# Patient Record
Sex: Female | Born: 1940 | Race: White | Hispanic: No | Smoking: Never smoker
Health system: Southern US, Community
[De-identification: ages and names within clinical notes are randomized; demographics above are authoritative.]

## PROBLEM LIST (undated history)

## (undated) ENCOUNTER — Emergency Department (HOSPITAL_COMMUNITY): Payer: Medicare Other | Source: Home / Self Care

## (undated) DIAGNOSIS — E119 Type 2 diabetes mellitus without complications: Secondary | ICD-10-CM

## (undated) DIAGNOSIS — F419 Anxiety disorder, unspecified: Secondary | ICD-10-CM

## (undated) DIAGNOSIS — K219 Gastro-esophageal reflux disease without esophagitis: Secondary | ICD-10-CM

## (undated) DIAGNOSIS — Z87442 Personal history of urinary calculi: Secondary | ICD-10-CM

## (undated) DIAGNOSIS — J309 Allergic rhinitis, unspecified: Secondary | ICD-10-CM

## (undated) DIAGNOSIS — M1990 Unspecified osteoarthritis, unspecified site: Secondary | ICD-10-CM

## (undated) DIAGNOSIS — H9192 Unspecified hearing loss, left ear: Secondary | ICD-10-CM

## (undated) DIAGNOSIS — G4730 Sleep apnea, unspecified: Secondary | ICD-10-CM

## (undated) HISTORY — PX: TUBAL LIGATION: SHX77

## (undated) HISTORY — PX: EYE SURGERY: SHX253

## (undated) HISTORY — PX: BREAST CYST ASPIRATION: SHX578

## (undated) HISTORY — PX: TONSILLECTOMY: SUR1361

---

## 2003-04-30 ENCOUNTER — Encounter: Admission: RE | Admit: 2003-04-30 | Discharge: 2003-04-30 | Payer: Self-pay

## 2005-07-20 ENCOUNTER — Ambulatory Visit: Payer: Self-pay | Admitting: Internal Medicine

## 2005-12-30 ENCOUNTER — Ambulatory Visit: Payer: Self-pay | Admitting: Internal Medicine

## 2006-05-09 HISTORY — PX: BREAST BIOPSY: SHX20

## 2006-11-02 ENCOUNTER — Ambulatory Visit: Payer: Self-pay | Admitting: Internal Medicine

## 2006-11-28 ENCOUNTER — Ambulatory Visit: Payer: Self-pay | Admitting: Surgery

## 2007-02-06 ENCOUNTER — Ambulatory Visit: Payer: Self-pay | Admitting: Gastroenterology

## 2007-05-21 ENCOUNTER — Ambulatory Visit: Payer: Self-pay | Admitting: Surgery

## 2007-11-05 ENCOUNTER — Ambulatory Visit: Payer: Self-pay | Admitting: Internal Medicine

## 2008-03-05 ENCOUNTER — Ambulatory Visit: Payer: Self-pay | Admitting: Internal Medicine

## 2008-03-09 ENCOUNTER — Ambulatory Visit: Payer: Self-pay | Admitting: Internal Medicine

## 2008-04-08 ENCOUNTER — Ambulatory Visit: Payer: Self-pay | Admitting: Internal Medicine

## 2008-05-09 ENCOUNTER — Ambulatory Visit: Payer: Self-pay | Admitting: Internal Medicine

## 2008-12-18 ENCOUNTER — Ambulatory Visit: Payer: Self-pay | Admitting: Internal Medicine

## 2010-06-03 ENCOUNTER — Ambulatory Visit: Payer: Self-pay | Admitting: Internal Medicine

## 2011-09-22 ENCOUNTER — Ambulatory Visit: Payer: Self-pay | Admitting: Internal Medicine

## 2013-01-22 ENCOUNTER — Ambulatory Visit: Payer: Self-pay | Admitting: Internal Medicine

## 2014-02-17 ENCOUNTER — Ambulatory Visit: Payer: Self-pay | Admitting: Physician Assistant

## 2014-02-27 ENCOUNTER — Ambulatory Visit: Payer: Self-pay | Admitting: Ophthalmology

## 2014-03-26 DIAGNOSIS — J302 Other seasonal allergic rhinitis: Secondary | ICD-10-CM | POA: Insufficient documentation

## 2014-03-31 ENCOUNTER — Ambulatory Visit: Payer: Self-pay | Admitting: Ophthalmology

## 2014-04-01 ENCOUNTER — Ambulatory Visit: Payer: Self-pay | Admitting: Ophthalmology

## 2014-04-23 ENCOUNTER — Ambulatory Visit: Payer: Self-pay | Admitting: Internal Medicine

## 2014-05-13 ENCOUNTER — Ambulatory Visit: Payer: Self-pay | Admitting: Internal Medicine

## 2014-08-30 NOTE — Op Note (Signed)
PATIENT NAME:  Gabriella [name], Gabriella [name] MR#:  [id number] DATE OF BIRTH:  01-Jul-1940  DATE OF PROCEDURE:  04/01/2014  PREOPERATIVE DIAGNOSIS:  Nuclear sclerotic cataract of the left eye.   POSTOPERATIVE DIAGNOSIS:  Nuclear sclerotic cataract of the left eye.   OPERATIVE PROCEDURE:  Cataract extraction by phacoemulsification with implant of intraocular lens to left eye.   SURGEON:  Birder Robson, MD.   ANESTHESIA:  1. Managed anesthesia care.  2. Topical tetracaine drops followed by 2% Xylocaine jelly applied in the preoperative holding area.   COMPLICATIONS:  None.   TECHNIQUE:   Stop and chop.  DESCRIPTION OF PROCEDURE:  The patient was examined and consented in the preoperative holding area where the aforementioned topical anesthesia was applied to the left eye and then brought back to the operating room where the left eye was prepped and draped in the usual sterile ophthalmic fashion and a lid speculum was placed. A paracentesis was created with the side port blade and the anterior chamber was filled with viscoelastic. A near clear corneal incision was performed with the steel keratome. A continuous curvilinear capsulorrhexis was performed with a cystotome followed by the capsulorrhexis forceps. Hydrodissection and hydrodelineation were carried out with BSS on a blunt cannula. The lens was removed in a stop and chop technique and the remaining cortical material was removed with the irrigation-aspiration handpiece. The capsular bag was inflated with viscoelastic and the Tecnis ZCB00 21.5 diopter lens, serial number [id number] was placed in the capsular bag without complication. The remaining viscoelastic was removed from the eye with the irrigation-aspiration handpiece. The wounds were hydrated. The anterior chamber was flushed with Miostat and the eye was inflated to physiologic pressure. 0.1 mL of cefuroxime concentration 10 mg/mL was placed in the anterior chamber. The wounds were found to be water  tight. The eye was dressed with Vigamox. The patient was given protective glasses to wear throughout the day and a shield with which to sleep tonight. The patient was also given drops with which to begin a drop regimen today and will follow-up with me in one day.    ____________________________ Livingston Diones. Javonne Dorko, MD wlp:bm D: 04/01/2014 17:31:54 ET T: 04/01/2014 23:18:41 ET JOB#: [id number]  cc: Ireoluwa Gorsline L. Shakala Marlatt, MD, <Dictator> Livingston Diones Tianah Lonardo MD ELECTRONICALLY SIGNED 04/02/2014 15:25

## 2015-05-27 DIAGNOSIS — E119 Type 2 diabetes mellitus without complications: Secondary | ICD-10-CM | POA: Insufficient documentation

## 2015-07-30 ENCOUNTER — Encounter: Admission: RE | Payer: Self-pay | Source: Ambulatory Visit

## 2015-07-30 ENCOUNTER — Ambulatory Visit: Admission: RE | Admit: 2015-07-30 | Payer: Medicare Other | Source: Ambulatory Visit | Admitting: Gastroenterology

## 2015-07-30 SURGERY — COLONOSCOPY WITH PROPOFOL
Anesthesia: General

## 2016-01-05 ENCOUNTER — Other Ambulatory Visit: Payer: Self-pay | Admitting: Internal Medicine

## 2016-01-05 DIAGNOSIS — Z1231 Encounter for screening mammogram for malignant neoplasm of breast: Secondary | ICD-10-CM

## 2016-01-22 ENCOUNTER — Ambulatory Visit: Payer: Medicare Other

## 2016-03-02 ENCOUNTER — Other Ambulatory Visit: Payer: Self-pay | Admitting: Internal Medicine

## 2016-03-02 ENCOUNTER — Ambulatory Visit
Admission: RE | Admit: 2016-03-02 | Discharge: 2016-03-02 | Disposition: A | Discharge status: Home / Self Care | Payer: Medicare Other | Source: Ambulatory Visit | Attending: Internal Medicine | Admitting: Internal Medicine

## 2016-03-02 DIAGNOSIS — Z1231 Encounter for screening mammogram for malignant neoplasm of breast: Secondary | ICD-10-CM

## 2016-03-02 DIAGNOSIS — R928 Other abnormal and inconclusive findings on diagnostic imaging of breast: Secondary | ICD-10-CM | POA: Insufficient documentation

## 2016-03-04 ENCOUNTER — Other Ambulatory Visit: Payer: Self-pay | Admitting: Internal Medicine

## 2016-03-04 DIAGNOSIS — N6320 Unspecified lump in the left breast, unspecified quadrant: Secondary | ICD-10-CM

## 2016-03-04 DIAGNOSIS — R928 Other abnormal and inconclusive findings on diagnostic imaging of breast: Secondary | ICD-10-CM

## 2016-03-22 ENCOUNTER — Other Ambulatory Visit: Payer: Medicare Other

## 2016-03-22 ENCOUNTER — Ambulatory Visit: Payer: Medicare Other

## 2016-04-18 ENCOUNTER — Ambulatory Visit
Admission: EM | Admit: 2016-04-18 | Discharge: 2016-04-18 | Discharge status: Against Medical Advice | Payer: Medicare Other | Attending: Family Medicine | Admitting: Family Medicine

## 2016-04-18 NOTE — ED Triage Notes (Signed)
Pt hurt her knee about 2 months ago, and then 2 weeks after that she hurt her left foot. It gets better then it worse again, She went to Urgent Care at hospital and they x ray it and there arent any broken bones. However she continues to have swelling in her foot and ankle and her knee gives out.

## 2016-04-18 NOTE — ED Provider Notes (Signed)
Patient left urgent care without being seen.    Marylene Land, NP 04/18/16 [phone]

## 2016-04-19 ENCOUNTER — Ambulatory Visit
Admission: EM | Admit: 2016-04-19 | Discharge: 2016-04-19 | Disposition: A | Discharge status: Home / Self Care | Payer: Medicare Other | Attending: Family Medicine | Admitting: Family Medicine

## 2016-04-19 ENCOUNTER — Encounter: Payer: Self-pay | Admitting: Emergency Medicine

## 2016-04-19 ENCOUNTER — Ambulatory Visit
Admission: RE | Admit: 2016-04-19 | Discharge: 2016-04-19 | Disposition: A | Discharge status: Home / Self Care | Payer: Medicare Other | Source: Ambulatory Visit | Attending: Internal Medicine | Admitting: Internal Medicine

## 2016-04-19 DIAGNOSIS — M25562 Pain in left knee: Secondary | ICD-10-CM

## 2016-04-19 DIAGNOSIS — N6322 Unspecified lump in the left breast, upper inner quadrant: Secondary | ICD-10-CM | POA: Insufficient documentation

## 2016-04-19 DIAGNOSIS — M79672 Pain in left foot: Secondary | ICD-10-CM | POA: Diagnosis not present

## 2016-04-19 DIAGNOSIS — N6320 Unspecified lump in the left breast, unspecified quadrant: Secondary | ICD-10-CM

## 2016-04-19 DIAGNOSIS — R928 Other abnormal and inconclusive findings on diagnostic imaging of breast: Secondary | ICD-10-CM

## 2016-04-19 DIAGNOSIS — M25472 Effusion, left ankle: Secondary | ICD-10-CM

## 2016-04-19 HISTORY — DX: Type 2 diabetes mellitus without complications: E11.9

## 2016-04-19 MED ORDER — DICLOFENAC SODIUM 75 MG PO TBEC: 75.0000 mg | DELAYED_RELEASE_TABLET | Freq: Two times a day (BID) | ORAL | 0 refills | Status: DC

## 2016-04-19 MED ORDER — KETOROLAC TROMETHAMINE 60 MG/2ML IM SOLN
60.0000 mg | Freq: Once | INTRAMUSCULAR | Status: AC
  Administered 2016-04-19: 60 mg via INTRAMUSCULAR

## 2016-04-19 NOTE — Discharge Instructions (Signed)
You were given a shot of Toradol today to help with inflammation. May take Voltaren twice a day as needed for pain and swelling. Elevated foot and knee as much as possible. Recommend referral to Orthopedic for further evaluation. Call today to make an appointment.

## 2016-04-19 NOTE — ED Triage Notes (Signed)
Patient states that her left knee gave out and has pain in her left knee for the past month.  Patient also states that couple of days ago she twisted her left foot and has had pain in her left foot.

## 2016-04-19 NOTE — ED Provider Notes (Signed)
CSN: [id number]     Arrival date & time 04/19/16  1016 History   First MD Initiated Contact with Patient 04/19/16 1239     Chief Complaint  Patient presents with  . Knee Pain    left  . Foot Pain    left   (Consider location/radiation/quality/duration/timing/severity/associated sxs/prior Treatment) 75 year old female presents with recurrent left knee pain. She had fallen and "pulled" her knee 3-4 months ago. Knee was healing but then she stood up from sitting position at home about 2 months ago and her knee "went out" again. She went to Urgent Care [facility]) and had x-rays done. No fracture or dislocation- thought to have a ligament strain. Rested knee and pain and symptoms improved but 2 days ago stood up quickly and tried to put weight on her left knee and it "gave out" again. Was hurting more yesterday but better today with rest. She has taken anti-inflammatory medication (uncertain of name) with minimal relief. Also having pain and swelling of her left ankle/foot. Has seen a Podiatrist (Dr. Vickki Muff) early this year for plantar fascitis. But now having more swelling around her ankle. Swelling improves when she elevates her foot but she states "I can't sit and put my foot and knee up all day".  No other chronic health issues except diabetes and anxiety.    The history is provided by the patient.    Past Medical History:  Diagnosis Date  . Diabetes mellitus without complication [facility])    Past Surgical History:  Procedure Laterality Date  . BREAST BIOPSY Right 2008   stereo-benign  . BREAST CYST ASPIRATION Left    History reviewed. No pertinent family history. Social History  Substance Use Topics  . Smoking status: Never Smoker  . Smokeless tobacco: Never Used  . Alcohol use No   OB History    No data available     Review of Systems  Constitutional: Negative for fatigue and fever.  Respiratory: Negative for chest tightness and shortness of breath.   Cardiovascular:  Positive for leg swelling. Negative for chest pain.  Musculoskeletal: Positive for arthralgias, gait problem (unable to fully put weight on left knee and foot) and joint swelling. Negative for back pain.  Skin: Negative for color change and rash.  Neurological: Negative for tremors, weakness and numbness.  Hematological: Negative for adenopathy. Does not bruise/bleed easily.    Allergies  Patient has no known allergies.  Home Medications   Prior to Admission medications   Medication Sig Start Date End Date Taking? Authorizing Provider  escitalopram (LEXAPRO) 10 MG tablet Take 10 mg by mouth daily.   Yes Historical Provider, MD  fluticasone (FLONASE) 50 MCG/ACT nasal spray Place 1 spray into both nostrils daily.   Yes Historical Provider, MD  glimepiride (AMARYL) 4 MG tablet Take 4 mg by mouth daily with breakfast.   Yes Historical Provider, MD  metFORMIN (GLUMETZA) 500 MG (MOD) 24 hr tablet Take 500 mg by mouth daily with breakfast.   Yes Historical Provider, MD  Multiple Vitamin (MULTIVITAMIN) tablet Take 1 tablet by mouth daily.   Yes Historical Provider, MD  omeprazole (PRILOSEC) 20 MG capsule Take 20 mg by mouth daily.   Yes Historical Provider, MD  diclofenac (VOLTAREN) 75 MG EC tablet Take 1 tablet (75 mg total) by mouth 2 (two) times daily. 04/19/16   Katy Apo, NP   Meds Ordered and Administered this Visit   Medications  ketorolac (TORADOL) injection 60 mg (60 mg Intramuscular Given  04/19/16 1315)    BP (!) 152/93 (BP Location: Left Arm)   Pulse 85   Temp 97.8 F (36.6 C) (Oral)   Resp 16   Ht 5\' 7"  (1.702 m)   Wt 180 lb (81.6 kg)   SpO2 100%   BMI 28.19 kg/m  No data found.   Physical Exam  Constitutional: She is oriented to person, place, and time. She appears well-developed and well-nourished. No distress.  Cardiovascular: Normal rate, regular rhythm, intact distal pulses and normal pulses.   Musculoskeletal: She exhibits edema and tenderness.       Left  knee: She exhibits decreased range of motion. She exhibits no swelling, no effusion, no ecchymosis, no deformity, no laceration, no erythema and normal patellar mobility. Tenderness found. Medial joint line and lateral joint line tenderness noted.       Left foot: There is tenderness and swelling. There is normal range of motion, normal capillary refill and no deformity.  Has decreased range of motion of left knee, especially with full flexion and extension. Tender along medial and lateral ligaments. Minimal swelling present. No bruising seen.  Left foot/ankle has swelling present on medial aspect of ankle and extending to dorsal aspect of foot. Non-pitting. Has full range of motion but with pain with rotation. Pulses are normal and equal bilaterally. Good capillary refill. No neuro/sensory deficits noted.   Feet:  Right Foot:  Skin Integrity: Negative for skin breakdown, erythema or warmth.  Left Foot:  Skin Integrity: Negative for skin breakdown, erythema or warmth.  Neurological: She is alert and oriented to person, place, and time. She has normal strength. No sensory deficit.  Skin: Skin is warm and dry. Capillary refill takes less than 2 seconds. No rash noted.  Psychiatric: Her speech is normal. Judgment and thought content normal. Her mood appears anxious. She is agitated. Cognition and memory are normal.    Urgent Care Course   Clinical Course     Procedures (including critical care time)  Labs Review Labs Reviewed - No data to display  Imaging Review US Breast Ltd Uni Left Inc Axilla  Result Date: 04/19/2016 CLINICAL DATA:  Screening recall for a left breast mass and left breast distortion. EXAM: 2D DIGITAL DIAGNOSTIC UNILATERAL LEFT MAMMOGRAM WITH CAD AND ADJUNCT TOMO LEFT BREAST ULTRASOUND COMPARISON:  Previous exam(s). ACR Breast Density Category c: The breast tissue is heterogeneously dense, which may obscure small masses. FINDINGS: The mass of concern in the upper inner  quadrant of the left breast, far posterior depth appears stable on the additional images as compared with several prior mammograms. There is however a subtle distortion in the upper inner quadrant of the left breast, posterior depth which warrants further evaluation with ultrasound. Mammographic images were processed with CAD. No palpable masses are identified in the upper inner quadrant of the left breast on physical exam. Ultrasound targeted to the upper inner quadrant of the left breast demonstrates no suspicious masses or areas of shadowing. Ultrasound of the left axilla demonstrates multiple normal-appearing lymph nodes. IMPRESSION: 1. There is a suspicious area of distortion in the upper inner quadrant of the left breast. 2.  No evidence of left axillary lymphadenopathy. RECOMMENDATION: Stereotactic biopsy is recommended for the distortion in the upper inner left breast. I have discussed the findings and recommendations with the patient. Results were also provided in writing at the conclusion of the visit. If applicable, a reminder letter will be sent to the patient regarding the next appointment. BI-RADS CATEGORY  4:  Suspicious. Electronically Signed   By: Ammie Ferrier M.D.   On: 04/19/2016 16:39   Mm Diag Breast Tomo Uni Left  Result Date: 04/19/2016 CLINICAL DATA:  Screening recall for a left breast mass and left breast distortion. EXAM: 2D DIGITAL DIAGNOSTIC UNILATERAL LEFT MAMMOGRAM WITH CAD AND ADJUNCT TOMO LEFT BREAST ULTRASOUND COMPARISON:  Previous exam(s). ACR Breast Density Category c: The breast tissue is heterogeneously dense, which may obscure small masses. FINDINGS: The mass of concern in the upper inner quadrant of the left breast, far posterior depth appears stable on the additional images as compared with several prior mammograms. There is however a subtle distortion in the upper inner quadrant of the left breast, posterior depth which warrants further evaluation with ultrasound.  Mammographic images were processed with CAD. No palpable masses are identified in the upper inner quadrant of the left breast on physical exam. Ultrasound targeted to the upper inner quadrant of the left breast demonstrates no suspicious masses or areas of shadowing. Ultrasound of the left axilla demonstrates multiple normal-appearing lymph nodes. IMPRESSION: 1. There is a suspicious area of distortion in the upper inner quadrant of the left breast. 2.  No evidence of left axillary lymphadenopathy. RECOMMENDATION: Stereotactic biopsy is recommended for the distortion in the upper inner left breast. I have discussed the findings and recommendations with the patient. Results were also provided in writing at the conclusion of the visit. If applicable, a reminder letter will be sent to the patient regarding the next appointment. BI-RADS CATEGORY  4: Suspicious. Electronically Signed   By: Ammie Ferrier M.D.   On: 04/19/2016 16:39     Visual Acuity Review  Right Eye Distance:   Left Eye Distance:   Bilateral Distance:    Right Eye Near:   Left Eye Near:    Bilateral Near:         MDM   1. Acute pain of left knee   2. Left ankle swelling   3. Foot pain, left    Patient anxious due to appointment within 2 hours for imaging for possible breast mass. Discussed with patient that she may have a knee ligament strain but additional evaluation is needed. Gave Toradol 60mg  IM today (no labs available to review kidney function but patient states no issues with renal function)- so a 30mg  dose would probably be more appropriate in the future. Encouraged to continue elevating knee and foot. May take Voltaren 75mg  twice a day as needed for pain and swelling. Recommend referral to Orthopedic for further evaluation and treatment for knee and foot/ankle pain. Patient will call today to schedule appointment or obtain referral through PCP. Follow-up with the Orthopedic as planned.      Katy Apo,  NP 04/20/16 1023

## 2016-04-19 NOTE — ED Notes (Signed)
Patient unable to stay for the medication hold due to that she has leave now to make her [place] appointment for this afternoon.

## 2016-04-20 ENCOUNTER — Other Ambulatory Visit: Payer: Self-pay | Admitting: Family Medicine

## 2016-04-20 DIAGNOSIS — R928 Other abnormal and inconclusive findings on diagnostic imaging of breast: Secondary | ICD-10-CM

## 2016-04-22 ENCOUNTER — Other Ambulatory Visit: Payer: Self-pay | Admitting: Internal Medicine

## 2016-04-22 DIAGNOSIS — R928 Other abnormal and inconclusive findings on diagnostic imaging of breast: Secondary | ICD-10-CM

## 2016-05-17 ENCOUNTER — Ambulatory Visit
Admission: RE | Admit: 2016-05-17 | Discharge: 2016-05-17 | Disposition: A | Discharge status: Home / Self Care | Payer: Medicare Other | Source: Ambulatory Visit | Attending: Internal Medicine | Admitting: Internal Medicine

## 2016-05-17 ENCOUNTER — Other Ambulatory Visit: Payer: Self-pay | Admitting: Internal Medicine

## 2016-05-17 DIAGNOSIS — R928 Other abnormal and inconclusive findings on diagnostic imaging of breast: Secondary | ICD-10-CM

## 2016-05-17 DIAGNOSIS — N6032 Fibrosclerosis of left breast: Secondary | ICD-10-CM | POA: Diagnosis not present

## 2016-05-17 DIAGNOSIS — N6092 Unspecified benign mammary dysplasia of left breast: Secondary | ICD-10-CM | POA: Diagnosis not present

## 2016-05-17 HISTORY — PX: BREAST BIOPSY: SHX20

## 2016-05-18 LAB — SURGICAL PATHOLOGY

## 2016-05-19 ENCOUNTER — Telehealth: Payer: Self-pay | Admitting: *Deleted

## 2016-05-19 NOTE — Telephone Encounter (Signed)
Request from Kalman Jewels in radiology to assist with scheduling patient for a surgical consult for benign breast biopsy.  Called patient.  She request a female Psychologist, sport and exercise.  Scheduled her to see Dr. Azalee Course on June 03, 2016 @ 10:45.  She is to call if she has any questions or needs.

## 2016-05-31 ENCOUNTER — Other Ambulatory Visit: Payer: Self-pay

## 2016-05-31 DIAGNOSIS — G4733 Obstructive sleep apnea (adult) (pediatric): Secondary | ICD-10-CM | POA: Insufficient documentation

## 2016-05-31 DIAGNOSIS — F419 Anxiety disorder, unspecified: Secondary | ICD-10-CM | POA: Insufficient documentation

## 2016-05-31 DIAGNOSIS — K219 Gastro-esophageal reflux disease without esophagitis: Secondary | ICD-10-CM | POA: Insufficient documentation

## 2016-06-02 ENCOUNTER — Telehealth: Payer: Self-pay | Admitting: *Deleted

## 2016-06-02 NOTE — Telephone Encounter (Signed)
Patient called yesterday with concerns that the surgeon I had scheduled her with was no longer there, and the office would call her to reschedule her with another surgeon.  Reviewed pathology and reasons for needing a surgical consult.  Scheduled patient to see Dr. Tamala Julian on 06/07/16 at 3:30.

## 2016-06-03 ENCOUNTER — Ambulatory Visit: Payer: Self-pay | Admitting: Surgery

## 2016-06-07 ENCOUNTER — Encounter: Payer: Self-pay | Admitting: *Deleted

## 2016-06-09 NOTE — Progress Notes (Signed)
Went with patient to see Dr. Tamala Julian for her surgical consult.  Offered support.  Option was given for excisional biopsy or close observation with a six month follow mammogram.  Patient chose 6 month follow-up.  She is to call if she has any questions or needs.

## 2016-09-15 ENCOUNTER — Other Ambulatory Visit: Payer: Self-pay | Admitting: Surgery

## 2016-09-15 DIAGNOSIS — N6320 Unspecified lump in the left breast, unspecified quadrant: Secondary | ICD-10-CM

## 2016-11-16 ENCOUNTER — Ambulatory Visit
Admission: RE | Admit: 2016-11-16 | Discharge: 2016-11-16 | Disposition: A | Discharge status: Home / Self Care | Payer: Medicare Other | Source: Ambulatory Visit | Attending: Surgery | Admitting: Surgery

## 2016-11-16 DIAGNOSIS — N6320 Unspecified lump in the left breast, unspecified quadrant: Secondary | ICD-10-CM | POA: Diagnosis present

## 2016-11-16 DIAGNOSIS — N6489 Other specified disorders of breast: Secondary | ICD-10-CM | POA: Diagnosis not present

## 2016-11-23 ENCOUNTER — Other Ambulatory Visit: Payer: Self-pay | Admitting: Surgery

## 2016-11-23 DIAGNOSIS — N63 Unspecified lump in unspecified breast: Secondary | ICD-10-CM

## 2016-12-05 ENCOUNTER — Ambulatory Visit
Admission: RE | Admit: 2016-12-05 | Discharge: 2016-12-05 | Disposition: A | Discharge status: Home / Self Care | Payer: Medicare Other | Source: Ambulatory Visit | Attending: Surgery | Admitting: Surgery

## 2016-12-05 DIAGNOSIS — E119 Type 2 diabetes mellitus without complications: Secondary | ICD-10-CM | POA: Insufficient documentation

## 2016-12-05 DIAGNOSIS — I4510 Unspecified right bundle-branch block: Secondary | ICD-10-CM | POA: Insufficient documentation

## 2016-12-05 DIAGNOSIS — R001 Bradycardia, unspecified: Secondary | ICD-10-CM | POA: Diagnosis not present

## 2016-12-05 DIAGNOSIS — N6320 Unspecified lump in the left breast, unspecified quadrant: Secondary | ICD-10-CM | POA: Insufficient documentation

## 2016-12-05 DIAGNOSIS — Z01818 Encounter for other preprocedural examination: Secondary | ICD-10-CM | POA: Insufficient documentation

## 2016-12-05 HISTORY — DX: Sleep apnea, unspecified: G47.30

## 2016-12-05 HISTORY — DX: Unspecified osteoarthritis, unspecified site: M19.90

## 2016-12-05 HISTORY — DX: Anxiety disorder, unspecified: F41.9

## 2016-12-05 HISTORY — DX: Gastro-esophageal reflux disease without esophagitis: K21.9

## 2016-12-05 LAB — CBC
HEMATOCRIT: 37.2 % (ref 35.0–47.0)
Hemoglobin: 12.7 g/dL (ref 12.0–16.0)
MCH: 30.2 pg (ref 26.0–34.0)
MCHC: 34.2 g/dL (ref 32.0–36.0)
MCV: 88.2 fL (ref 80.0–100.0)
Platelets: 228 10*3/uL (ref 150–440)
RBC: 4.22 MIL/uL (ref 3.80–5.20)
RDW: 14 % (ref 11.5–14.5)
WBC: 8.6 10*3/uL (ref 3.6–11.0)

## 2016-12-05 LAB — COMPREHENSIVE METABOLIC PANEL
ALK PHOS: 72 U/L (ref 38–126)
ALT: 14 U/L (ref 14–54)
AST: 15 U/L (ref 15–41)
Albumin: 3.8 g/dL (ref 3.5–5.0)
Anion gap: 7 (ref 5–15)
BILIRUBIN TOTAL: 0.5 mg/dL (ref 0.3–1.2)
BUN: 17 mg/dL (ref 6–20)
CALCIUM: 9.1 mg/dL (ref 8.9–10.3)
CHLORIDE: 104 mmol/L (ref 101–111)
CO2: 27 mmol/L (ref 22–32)
CREATININE: 0.77 mg/dL (ref 0.44–1.00)
Glucose, Bld: 144 mg/dL — ABNORMAL HIGH (ref 65–99)
Potassium: 4.2 mmol/L (ref 3.5–5.1)
Sodium: 138 mmol/L (ref 135–145)
TOTAL PROTEIN: 6.8 g/dL (ref 6.5–8.1)

## 2016-12-05 LAB — DIFFERENTIAL
BASOS PCT: 0 %
Basophils Absolute: 0 10*3/uL (ref 0–0.1)
Eosinophils Absolute: 0.1 10*3/uL (ref 0–0.7)
Eosinophils Relative: 2 %
LYMPHS ABS: 1.5 10*3/uL (ref 1.0–3.6)
Lymphocytes Relative: 17 %
MONO ABS: 0.6 10*3/uL (ref 0.2–0.9)
MONOS PCT: 7 %
NEUTROS ABS: 6.4 10*3/uL (ref 1.4–6.5)
Neutrophils Relative %: 74 %

## 2016-12-05 NOTE — Patient Instructions (Addendum)
  Your procedure is scheduled on: December 13, 2016 ([place]) Report to [facility] ARRIVAL TIME 7 :45 AM Remember: Instructions that are not followed completely may result in serious medical risk, up to and including death, or upon the discretion of your surgeon and anesthesiologist your surgery may need to be rescheduled.    _x___ 1. Do not eat food or drink liquids after midnight. No gum chewing or  hard candies                             .     __x__ 2. No Alcohol for 24 hours before or after surgery.   __x__3. No Smoking for 24 prior to surgery.   ____  4. Bring all medications with you on the day of surgery if instructed.    __x__ 5. Notify your doctor if there is any change in your medical condition     (cold, fever, infections).     Do not wear jewelry, make-up, hairpins, clips or nail polish.  Do not wear lotions, powders, or perfumes.   Do not shave 48 hours prior to surgery. Men may shave face and neck.  Do not bring valuables to the hospital.    [facility] is not responsible for any belongings or valuables.               Contacts, dentures or bridgework may not be worn into surgery.  Leave your suitcase in the car. After surgery it may be brought to your room.  For patients admitted to the hospital, discharge time is determined by your  treatment team                        Patients discharged the day of surgery will not be allowed to drive home.  You will need someone to drive you home and stay with you the night of your procedure.    Please read over the following fact sheets that you were given:   [facility] Preparing for Surgery and or MRSA Information   TAKE THE FOLLOWING MEDICATIONS THE MORNING OF SURGERY WITH A SIP OF WATER :  1. Omeprazole  (Omeprazole at bedtime the night prior to surgery , August 6 )  2.Escitalopram  3.  4.  5.  6.  ____Fleets enema or Magnesium Citrate as directed.   _x___ Use CHG Soap or sage wipes as directed on instruction  sheet   ____ Use inhalers on the day of surgery and bring to hospital day of surgery  _x___ Stop Metformin 2 days prior to surgery.(STOP METFORMIN ON AUGUST 5)    ____ Take 1/2 of usual insulin dose the night before surgery and none on the morning     surgery.   _x___ Follow recommendations from Cardiologist, Pulmonologist or PCP regarding          stopping Aspirin, Coumadin, Plavix ,Eliquis, Effient, or Pradaxa, and Pletal.  X____Stop Anti-inflammatories such as Advil, Aleve, Ibuprofen, Motrin, Naproxen, Naprosyn, Goodies powders or aspirin products. OK to take Tylenol (STOP IBUPROFEN AND DICLOFENAC  NOW )   _x___ Stop supplements until after surgery.  But may continue Vitamin D, Vitamin B, and multivitamin       ____ Bring C-Pap to the hospital.

## 2016-12-12 NOTE — Pre-Procedure Instructions (Signed)
EKG NOTED. 

## 2016-12-13 ENCOUNTER — Ambulatory Visit: Payer: Medicare Other | Admitting: Registered Nurse

## 2016-12-13 ENCOUNTER — Ambulatory Visit
Admission: RE | Admit: 2016-12-13 | Discharge: 2016-12-13 | Disposition: A | Discharge status: Home / Self Care | Payer: Medicare Other | Source: Ambulatory Visit | Attending: Surgery | Admitting: Surgery

## 2016-12-13 ENCOUNTER — Encounter: Payer: Self-pay | Admitting: Anesthesiology

## 2016-12-13 ENCOUNTER — Encounter
Admission: RE | Disposition: A | Discharge status: Home / Self Care | Payer: Self-pay | Source: Ambulatory Visit | Attending: Surgery

## 2016-12-13 DIAGNOSIS — Z7984 Long term (current) use of oral hypoglycemic drugs: Secondary | ICD-10-CM | POA: Insufficient documentation

## 2016-12-13 DIAGNOSIS — N6092 Unspecified benign mammary dysplasia of left breast: Secondary | ICD-10-CM | POA: Insufficient documentation

## 2016-12-13 DIAGNOSIS — N6320 Unspecified lump in the left breast, unspecified quadrant: Secondary | ICD-10-CM | POA: Diagnosis present

## 2016-12-13 DIAGNOSIS — K219 Gastro-esophageal reflux disease without esophagitis: Secondary | ICD-10-CM | POA: Insufficient documentation

## 2016-12-13 DIAGNOSIS — G4730 Sleep apnea, unspecified: Secondary | ICD-10-CM | POA: Insufficient documentation

## 2016-12-13 DIAGNOSIS — F419 Anxiety disorder, unspecified: Secondary | ICD-10-CM | POA: Diagnosis not present

## 2016-12-13 DIAGNOSIS — N63 Unspecified lump in unspecified breast: Secondary | ICD-10-CM

## 2016-12-13 DIAGNOSIS — E119 Type 2 diabetes mellitus without complications: Secondary | ICD-10-CM | POA: Diagnosis not present

## 2016-12-13 HISTORY — PX: BREAST LUMPECTOMY: SHX2

## 2016-12-13 HISTORY — PX: BREAST LUMPECTOMY WITH NEEDLE LOCALIZATION: SHX5759

## 2016-12-13 HISTORY — PX: BREAST EXCISIONAL BIOPSY: SUR124

## 2016-12-13 LAB — GLUCOSE, CAPILLARY
Glucose-Capillary: 167 mg/dL — ABNORMAL HIGH (ref 65–99)
Glucose-Capillary: 193 mg/dL — ABNORMAL HIGH (ref 65–99)

## 2016-12-13 SURGERY — BREAST LUMPECTOMY WITH NEEDLE LOCALIZATION
Anesthesia: General | Site: Breast | Laterality: Left | Wound class: Clean

## 2016-12-13 MED ORDER — ONDANSETRON HCL 4 MG/2ML IJ SOLN: 4.0000 mg | Freq: Once | INTRAMUSCULAR | Status: DC | PRN

## 2016-12-13 MED ORDER — ONDANSETRON HCL 4 MG/2ML IJ SOLN
INTRAMUSCULAR | Status: AC
  Filled 2016-12-13: qty 2

## 2016-12-13 MED ORDER — HYDROCODONE-ACETAMINOPHEN 5-325 MG PO TABS: 1.0000 | ORAL_TABLET | ORAL | 0 refills | Status: DC | PRN

## 2016-12-13 MED ORDER — ONDANSETRON HCL 4 MG/2ML IJ SOLN
INTRAMUSCULAR | Status: DC | PRN
  Administered 2016-12-13: 4 mg via INTRAVENOUS

## 2016-12-13 MED ORDER — LIDOCAINE HCL (CARDIAC) 20 MG/ML IV SOLN
INTRAVENOUS | Status: DC | PRN
  Administered 2016-12-13: 100 mg via INTRAVENOUS

## 2016-12-13 MED ORDER — FENTANYL CITRATE (PF) 100 MCG/2ML IJ SOLN
INTRAMUSCULAR | Status: DC | PRN
  Administered 2016-12-13 (×2): 25 ug via INTRAVENOUS
  Administered 2016-12-13: 50 ug via INTRAVENOUS

## 2016-12-13 MED ORDER — PROPOFOL 10 MG/ML IV BOLUS
INTRAVENOUS | Status: DC | PRN
  Administered 2016-12-13: 150 mg via INTRAVENOUS

## 2016-12-13 MED ORDER — FAMOTIDINE 20 MG PO TABS
ORAL_TABLET | ORAL | Status: AC
  Administered 2016-12-13: 20 mg
  Filled 2016-12-13: qty 1

## 2016-12-13 MED ORDER — LIDOCAINE HCL (PF) 2 % IJ SOLN
INTRAMUSCULAR | Status: AC
  Filled 2016-12-13: qty 2

## 2016-12-13 MED ORDER — HYDROCODONE-ACETAMINOPHEN 5-325 MG PO TABS: 1.0000 | ORAL_TABLET | ORAL | Status: DC | PRN

## 2016-12-13 MED ORDER — FENTANYL CITRATE (PF) 100 MCG/2ML IJ SOLN: 25.0000 ug | INTRAMUSCULAR | Status: DC | PRN

## 2016-12-13 MED ORDER — BUPIVACAINE-EPINEPHRINE 0.5% -1:200000 IJ SOLN
INTRAMUSCULAR | Status: DC | PRN
  Administered 2016-12-13: 9 mL

## 2016-12-13 MED ORDER — DEXAMETHASONE SODIUM PHOSPHATE 10 MG/ML IJ SOLN
INTRAMUSCULAR | Status: AC
  Filled 2016-12-13: qty 1

## 2016-12-13 MED ORDER — MIDAZOLAM HCL 2 MG/2ML IJ SOLN
INTRAMUSCULAR | Status: AC
  Filled 2016-12-13: qty 2

## 2016-12-13 MED ORDER — MIDAZOLAM HCL 2 MG/2ML IJ SOLN
INTRAMUSCULAR | Status: DC | PRN
  Administered 2016-12-13: 2 mg via INTRAVENOUS

## 2016-12-13 MED ORDER — FENTANYL CITRATE (PF) 100 MCG/2ML IJ SOLN
INTRAMUSCULAR | Status: AC
  Filled 2016-12-13: qty 2

## 2016-12-13 MED ORDER — DEXAMETHASONE SODIUM PHOSPHATE 10 MG/ML IJ SOLN
INTRAMUSCULAR | Status: DC | PRN
  Administered 2016-12-13: 10 mg via INTRAVENOUS

## 2016-12-13 MED ORDER — GLYCOPYRROLATE 0.2 MG/ML IJ SOLN
INTRAMUSCULAR | Status: AC
  Filled 2016-12-13: qty 1

## 2016-12-13 MED ORDER — PROPOFOL 10 MG/ML IV BOLUS
INTRAVENOUS | Status: AC
  Filled 2016-12-13: qty 40

## 2016-12-13 MED ORDER — BUPIVACAINE-EPINEPHRINE (PF) 0.5% -1:200000 IJ SOLN
INTRAMUSCULAR | Status: AC
  Filled 2016-12-13: qty 30

## 2016-12-13 MED ORDER — SODIUM CHLORIDE 0.9 % IV SOLN
INTRAVENOUS | Status: DC
  Administered 2016-12-13: 09:00:00 via INTRAVENOUS

## 2016-12-13 MED ORDER — GLYCOPYRROLATE 0.2 MG/ML IJ SOLN
INTRAMUSCULAR | Status: DC | PRN
  Administered 2016-12-13: 0.2 mg via INTRAVENOUS

## 2016-12-13 SURGICAL SUPPLY — 27 items
BLADE SURG 15 STRL LF DISP TIS (BLADE) ×1 IMPLANT
BLADE SURG 15 STRL SS (BLADE) ×1
CANISTER SUCT 1200ML W/VALVE (MISCELLANEOUS) ×2 IMPLANT
CHLORAPREP W/TINT 26ML (MISCELLANEOUS) ×2 IMPLANT
DERMABOND ADVANCED (GAUZE/BANDAGES/DRESSINGS) ×1
DERMABOND ADVANCED .7 DNX12 (GAUZE/BANDAGES/DRESSINGS) ×1 IMPLANT
DEVICE DUBIN SPECIMEN MAMMOGRA (MISCELLANEOUS) ×2 IMPLANT
DRAPE LAPAROTOMY 77X122 PED (DRAPES) ×2 IMPLANT
ELECT REM PT RETURN 9FT ADLT (ELECTROSURGICAL) ×2
ELECTRODE REM PT RTRN 9FT ADLT (ELECTROSURGICAL) ×1 IMPLANT
GLOVE BIO SURGEON STRL SZ7.5 (GLOVE) ×2 IMPLANT
GOWN STRL REUS W/ TWL LRG LVL3 (GOWN DISPOSABLE) ×2 IMPLANT
GOWN STRL REUS W/TWL LRG LVL3 (GOWN DISPOSABLE) ×2
KIT RM TURNOVER STRD PROC AR (KITS) ×2 IMPLANT
LABEL OR SOLS (LABEL) ×2 IMPLANT
MARGIN MAP 10MM (MISCELLANEOUS) ×2 IMPLANT
NEEDLE HYPO 25X1 1.5 SAFETY (NEEDLE) ×2 IMPLANT
PACK BASIN MINOR ARMC (MISCELLANEOUS) ×2 IMPLANT
SUT CHROMIC 3 0 SH 27 (SUTURE) ×2 IMPLANT
SUT CHROMIC 4 0 RB 1X27 (SUTURE) ×2 IMPLANT
SUT ETHILON 3-0 FS-10 30 BLK (SUTURE) ×2
SUT MNCRL 4-0 (SUTURE) ×1
SUT MNCRL 4-0 27XMFL (SUTURE) ×1
SUTURE EHLN 3-0 FS-10 30 BLK (SUTURE) ×1 IMPLANT
SUTURE MNCRL 4-0 27XMF (SUTURE) ×1 IMPLANT
SYRINGE 10CC LL (SYRINGE) ×2 IMPLANT
WATER STERILE IRR 1000ML POUR (IV SOLUTION) ×2 IMPLANT

## 2016-12-13 NOTE — Transfer of Care (Signed)
Immediate Anesthesia Transfer of Care Note  Patient: Gabriella [name]  Procedure(s) Performed: Procedure(s): BREAST LUMPECTOMY WITH NEEDLE LOCALIZATION (Left)  Patient Location: PACU  Anesthesia Type:General  Level of Consciousness: sedated  Airway & Oxygen Therapy: Patient Spontanous Breathing and Patient connected to face mask oxygen  Post-op Assessment: Report given to RN and Post -op Vital signs reviewed and stable  Post vital signs: Reviewed and stable  Last Vitals:  Vitals:   12/13/16 0844 12/13/16 1048  BP: (!) 162/75 (!) 163/90  Pulse: 64 96  Resp: 16 (!) 29  Temp: (!) 35.9 C (!) 22.4 C    Complications: No apparent anesthesia complications

## 2016-12-13 NOTE — Anesthesia Procedure Notes (Signed)
Procedure Name: LMA Insertion Date/Time: 12/13/2016 9:32 AM Performed by: Doreen Salvage Pre-anesthesia Checklist: Patient identified, Patient being monitored, Timeout performed, Emergency Drugs available and Suction available Patient Re-evaluated:Patient Re-evaluated prior to induction Oxygen Delivery Method: Circle system utilized Preoxygenation: Pre-oxygenation with 100% oxygen Induction Type: IV induction Ventilation: Mask ventilation without difficulty LMA: LMA inserted LMA Size: 3.5 Tube type: Oral Number of attempts: 1 Placement Confirmation: positive ETCO2 and breath sounds checked- equal and bilateral Tube secured with: Tape Dental Injury: Teeth and Oropharynx as per pre-operative assessment

## 2016-12-13 NOTE — Anesthesia Post-op Follow-up Note (Signed)
Anesthesia QCDR form completed.        

## 2016-12-13 NOTE — Anesthesia Preprocedure Evaluation (Addendum)
Anesthesia Evaluation  Patient identified by MRN, date of birth, ID band Patient awake    Reviewed: Allergy & Precautions, NPO status , Patient's Chart, lab work & pertinent test results  Airway Mallampati: IV  TM Distance: <3 FB   Mouth opening: Limited Mouth Opening  Dental  (+) Caps, Chipped   Pulmonary sleep apnea ,    Pulmonary exam normal        Cardiovascular Normal cardiovascular exam     Neuro/Psych Anxiety    GI/Hepatic Neg liver ROS, GERD  Medicated and Controlled,  Endo/Other  diabetes, Well Controlled, Type 2, Oral Hypoglycemic Agents  Renal/GU negative Renal ROS     Musculoskeletal  (+) Arthritis , Osteoarthritis,    Abdominal Normal abdominal exam  (+)   Peds  Hematology negative hematology ROS (+)   Anesthesia Other Findings   Reproductive/Obstetrics                            Anesthesia Physical Anesthesia Plan  ASA: III  Anesthesia Plan: General   Post-op Pain Management:    Induction: Intravenous  PONV Risk Score and Plan:   Airway Management Planned: LMA  Additional Equipment:   Intra-op Plan:   Post-operative Plan: Extubation in OR  Informed Consent: I have reviewed the patients History and Physical, chart, labs and discussed the procedure including the risks, benefits and alternatives for the proposed anesthesia with the patient or authorized representative who has indicated his/her understanding and acceptance.   Dental advisory given  Plan Discussed with: CRNA and Surgeon  Anesthesia Plan Comments:        Anesthesia Quick Evaluation

## 2016-12-13 NOTE — Discharge Instructions (Signed)
Take Tylenol or Norco if needed for pain. ° °Should not drive or do anything dangerous when taking Norco. ° °May shower and blot dry. ° °Wear bra as desired for comfort and support. °

## 2016-12-13 NOTE — Anesthesia Postprocedure Evaluation (Signed)
Anesthesia Post Note  Patient: JODIE CAVEY  Procedure(s) Performed: Procedure(s) (LRB): BREAST LUMPECTOMY WITH NEEDLE LOCALIZATION (Left)  Patient location during evaluation: PACU Anesthesia Type: General Level of consciousness: awake and alert and oriented Pain management: pain level controlled Vital Signs Assessment: post-procedure vital signs reviewed and stable Respiratory status: spontaneous breathing Cardiovascular status: blood pressure returned to baseline Anesthetic complications: no     Last Vitals:  Vitals:   12/13/16 1155 12/13/16 1200  BP: (!) 154/81 (!) 154/78  Pulse: 75   Resp: 16 16  Temp: 36.4 C     Last Pain:  Vitals:   12/13/16 1200  TempSrc:   PainSc: 0-No pain                 Neilah Fulwider

## 2016-12-13 NOTE — Consult Note (Signed)
She has a history of mammogram finding of a density in the upper inner quadrant of the left breast. She had stereotactic needle biopsy. 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 The radiologist was concerned that this was discordant with the mammographic findings and recommended excision.  She has had preoperative x-ray needle localization with insertion of a Kopans wire. I have reviewed those subsequent images demonstrating the proximity of the Kopans wire with the density and the top hat biopsy marker.  She reports no change in overall condition since the recent office visit.  The left side has already been marked YES.  I discussed the plan for surgery.

## 2016-12-13 NOTE — Op Note (Signed)
OPERATIVE REPORT  PREOPERATIVE  DIAGNOSIS: . Left breast mass  POSTOPERATIVE DIAGNOSIS: . Left breast mass  PROCEDURE: . Excision left breast mass  ANESTHESIA:  General  SURGEON: Rochel Brome  MD   INDICATIONS: . She had mammogram findings of a density of the upper inner quadrant of the left breast. Stereotactic needle biopsy demonstrated some periductal fibrosis and focal columnar cell change and usual ductal hyperplasia.. The radiologist reported that the microscope findings were discordant with the mammogram findings and recommended excision. She had preoperative x-ray needle localization. Mammogram images with Kopan's wire were reviewed seeing the proximity of the Kopan's wire and the biopsy marker and the density.  With the patient on the operating table in the supine position the dressing was removed from the left breast exposing the Kopan's wire in the upper inner quadrant. The wire was cut 2 cm from the skin. The breast was prepared with ChloraPrep and draped in a sterile manner.  A curvilinear incision was made 10 cm from the nipple from the 10:00 to 11:30 position. This was carried down through subcutaneous tissues. Several small bleeding points are cauterized. There was one arterial bleeding point which was cauterized and also suture ligated with 3-0 chromic. Dissection was carried down to encounter the wire. A portion of tissue surrounding the thick portion of the wire was excised. As this was being excised margin maps were sutured to the specimen to mark the superficial deep medial lateral cranial and caudal sides of the specimen. As this was been excised there was some finding of minimal firmness within the specimen. There appeared to be no significant abnormality of the cut surface. The specimen was submitted for specimen mammogram which demonstrated the location of the biopsy marker and has been submitted for routine pathology  The wound was inspected and several small bleeding  points cauterized. Subcuticular tissues were infiltrated with half percent Sensorcaine with epinephrine. The subcuticular tissues were approximated with interrupted 4-0 chromic. The skin was closed with running 4-0 Monocryl subcuticular suture and Dermabond  The patient appeared to tolerate the procedure satisfactorily and was then prepared for transfer to the recovery room  [employer].D.

## 2016-12-15 LAB — SURGICAL PATHOLOGY

## 2017-05-31 ENCOUNTER — Ambulatory Visit: Payer: Medicare Other | Admitting: Anesthesiology

## 2017-05-31 ENCOUNTER — Encounter: Payer: Self-pay | Admitting: *Deleted

## 2017-05-31 ENCOUNTER — Encounter
Admission: RE | Disposition: A | Discharge status: Home / Self Care | Payer: Self-pay | Source: Ambulatory Visit | Attending: Internal Medicine

## 2017-05-31 ENCOUNTER — Ambulatory Visit
Admission: RE | Admit: 2017-05-31 | Discharge: 2017-05-31 | Disposition: A | Discharge status: Home / Self Care | Payer: Medicare Other | Source: Ambulatory Visit | Attending: Internal Medicine | Admitting: Internal Medicine

## 2017-05-31 DIAGNOSIS — K219 Gastro-esophageal reflux disease without esophagitis: Secondary | ICD-10-CM | POA: Diagnosis not present

## 2017-05-31 DIAGNOSIS — F419 Anxiety disorder, unspecified: Secondary | ICD-10-CM | POA: Diagnosis not present

## 2017-05-31 DIAGNOSIS — M1990 Unspecified osteoarthritis, unspecified site: Secondary | ICD-10-CM | POA: Diagnosis not present

## 2017-05-31 DIAGNOSIS — Z79899 Other long term (current) drug therapy: Secondary | ICD-10-CM | POA: Insufficient documentation

## 2017-05-31 DIAGNOSIS — J309 Allergic rhinitis, unspecified: Secondary | ICD-10-CM | POA: Diagnosis not present

## 2017-05-31 DIAGNOSIS — G4733 Obstructive sleep apnea (adult) (pediatric): Secondary | ICD-10-CM | POA: Diagnosis not present

## 2017-05-31 DIAGNOSIS — E119 Type 2 diabetes mellitus without complications: Secondary | ICD-10-CM | POA: Insufficient documentation

## 2017-05-31 DIAGNOSIS — Z1211 Encounter for screening for malignant neoplasm of colon: Secondary | ICD-10-CM | POA: Diagnosis present

## 2017-05-31 DIAGNOSIS — K640 First degree hemorrhoids: Secondary | ICD-10-CM | POA: Insufficient documentation

## 2017-05-31 DIAGNOSIS — Z7984 Long term (current) use of oral hypoglycemic drugs: Secondary | ICD-10-CM | POA: Insufficient documentation

## 2017-05-31 DIAGNOSIS — D125 Benign neoplasm of sigmoid colon: Secondary | ICD-10-CM | POA: Diagnosis not present

## 2017-05-31 DIAGNOSIS — H9192 Unspecified hearing loss, left ear: Secondary | ICD-10-CM | POA: Diagnosis not present

## 2017-05-31 DIAGNOSIS — Z87442 Personal history of urinary calculi: Secondary | ICD-10-CM | POA: Diagnosis not present

## 2017-05-31 DIAGNOSIS — K5730 Diverticulosis of large intestine without perforation or abscess without bleeding: Secondary | ICD-10-CM | POA: Diagnosis not present

## 2017-05-31 HISTORY — DX: Allergic rhinitis, unspecified: J30.9

## 2017-05-31 HISTORY — DX: Unspecified hearing loss, left ear: H91.92

## 2017-05-31 HISTORY — DX: Personal history of urinary calculi: Z87.442

## 2017-05-31 HISTORY — PX: COLONOSCOPY WITH PROPOFOL: SHX5780

## 2017-05-31 LAB — GLUCOSE, CAPILLARY: GLUCOSE-CAPILLARY: 153 mg/dL — AB (ref 65–99)

## 2017-05-31 SURGERY — COLONOSCOPY WITH PROPOFOL
Anesthesia: General

## 2017-05-31 MED ORDER — PROPOFOL 10 MG/ML IV BOLUS
INTRAVENOUS | Status: DC | PRN
  Administered 2017-05-31: 100 mg via INTRAVENOUS

## 2017-05-31 MED ORDER — PROPOFOL 500 MG/50ML IV EMUL: INTRAVENOUS | Status: DC | PRN

## 2017-05-31 MED ORDER — FENTANYL CITRATE (PF) 100 MCG/2ML IJ SOLN
INTRAMUSCULAR | Status: AC
  Filled 2017-05-31: qty 2

## 2017-05-31 MED ORDER — LIDOCAINE 2% (20 MG/ML) 5 ML SYRINGE
INTRAMUSCULAR | Status: DC | PRN
  Administered 2017-05-31: 40 mg via INTRAVENOUS

## 2017-05-31 MED ORDER — SODIUM CHLORIDE 0.9 % IV SOLN
INTRAVENOUS | Status: DC
  Administered 2017-05-31 (×2): via INTRAVENOUS

## 2017-05-31 MED ORDER — PROPOFOL 500 MG/50ML IV EMUL
INTRAVENOUS | Status: DC | PRN
  Administered 2017-05-31: 140 ug/kg/min via INTRAVENOUS

## 2017-05-31 MED ORDER — EPHEDRINE SULFATE 50 MG/ML IJ SOLN
INTRAMUSCULAR | Status: AC
  Filled 2017-05-31: qty 1

## 2017-05-31 MED ORDER — PROPOFOL 10 MG/ML IV BOLUS
INTRAVENOUS | Status: AC
  Filled 2017-05-31: qty 20

## 2017-05-31 MED ORDER — LIDOCAINE HCL (PF) 2 % IJ SOLN
INTRAMUSCULAR | Status: AC
  Filled 2017-05-31: qty 10

## 2017-05-31 MED ORDER — FENTANYL CITRATE (PF) 100 MCG/2ML IJ SOLN
INTRAMUSCULAR | Status: DC | PRN
  Administered 2017-05-31 (×2): 50 ug via INTRAVENOUS

## 2017-05-31 MED ORDER — PHENYLEPHRINE HCL 10 MG/ML IJ SOLN
INTRAMUSCULAR | Status: AC
  Filled 2017-05-31: qty 1

## 2017-05-31 MED ORDER — PROPOFOL 500 MG/50ML IV EMUL
INTRAVENOUS | Status: AC
  Filled 2017-05-31: qty 50

## 2017-05-31 NOTE — H&P (Signed)
Outpatient short stay form Pre-procedure 05/31/2017 8:38 AM Gabriella [name] Gabriella [name], M.D.  Primary Physician: Tracie Harrier, M.D. Reason for visit:  Colon cancer screening  History of present illness: Patient is a pleasant 77 year old female here for colon cancer screening, average risk. Patient has mild diarrhea related to metformin, perhaps. Diarrhea is not clinically significant. No rectal bleeding. No change in bowel habits other than that above.     Current Facility-Administered Medications:  .  0.9 %  sodium chloride infusion, , Intravenous, Continuous, [place], Benay Pike, MD, Last Rate: 20 mL/hr at 05/31/17 0755  Medications Prior to Admission  Medication Sig Dispense Refill Last Dose  . escitalopram (LEXAPRO) 10 MG tablet Take 10 mg by mouth daily.   Past Week at Unknown time  . glimepiride (AMARYL) 4 MG tablet Take 4 mg by mouth daily with breakfast.   Past Week at Unknown time  . metFORMIN (GLUCOPHAGE) 500 MG tablet Take 500 mg by mouth daily.   Past Week at Unknown time  . omeprazole (PRILOSEC) 20 MG capsule Take 20 mg by mouth daily.   Past Week at Unknown time  . acetaminophen (TYLENOL) 500 MG tablet Take 500-1,000 mg by mouth every 6 (six) hours as needed (for pain.).   Past Week at Unknown time  . diclofenac (VOLTAREN) 75 MG EC tablet Take 1 tablet (75 mg total) by mouth 2 (two) times daily. (Patient taking differently: Take 75 mg by mouth 2 (two) times daily as needed (for pain.). ) 30 tablet 0 Past Month at Unknown time  . fluticasone (FLONASE) 50 MCG/ACT nasal spray Place 1 spray into both nostrils daily as needed (for allergies).    Not Taking at Unknown time  . HYDROcodone-acetaminophen (NORCO) 5-325 MG tablet Take 1-2 tablets by mouth every 4 (four) hours as needed for moderate pain. (Patient not taking: Reported on 05/31/2017) 12 tablet 0 Not Taking at Unknown time  . ibuprofen (ADVIL,MOTRIN) 200 MG tablet Take 400 mg by mouth every 8 (eight) hours as needed (for pain.).     12/06/2016     No Known Allergies   Past Medical History:  Diagnosis Date  . Allergic rhinitis   . Anxiety   . Arthritis   . Deaf, left   . Diabetes mellitus without complication ([place])   . GERD (gastroesophageal reflux disease)   . History of kidney stones   . Sleep apnea    OSA---HAS C-PAP BUT   DOES NOT USE    Review of systems:      Physical Exam  General appearance: alert, cooperative and appears stated age Resp: clear to auscultation bilaterally Cardio: regular rate and rhythm, S1, S2 normal, no murmur, click, rub or gallop GI: soft, non-tender; bowel sounds normal; no masses,  no organomegaly     Planned procedures: Colonoscopy. The patient understands the nature of the planned procedure, indications, risks, alternatives and potential complications including but not limited to bleeding, infection, perforation, damage to internal organs and possible oversedation/side effects from anesthesia. The patient agrees and gives consent to proceed.  Please refer to procedure notes for findings, recommendations and patient disposition/instructions.    Gabriella [name] Gabriella [name], M.D. Gastroenterology 05/31/2017  8:38 AM

## 2017-05-31 NOTE — Anesthesia Post-op Follow-up Note (Signed)
Anesthesia QCDR form completed.        

## 2017-05-31 NOTE — Transfer of Care (Signed)
Immediate Anesthesia Transfer of Care Note  Patient: Gabriella [name]  Procedure(s) Performed: COLONOSCOPY WITH PROPOFOL (N/A )  Patient Location: PACU and Endoscopy Unit  Anesthesia Type:General  Level of Consciousness: sedated  Airway & Oxygen Therapy: Patient Spontanous Breathing and Patient connected to nasal cannula oxygen  Post-op Assessment: Report given to RN and Post -op Vital signs reviewed and stable  Post vital signs: Reviewed and stable  Last Vitals:  Vitals:   05/31/17 0731  BP: 135/67  Pulse: 71  Resp: 18  Temp: (!) 35.8 C  SpO2: 100%    Last Pain:  Vitals:   05/31/17 0731  TempSrc: Tympanic         Complications: No apparent anesthesia complications

## 2017-05-31 NOTE — Op Note (Signed)
[facility] Gastroenterology Patient Name: Gabriella [name] Procedure Date: 05/31/2017 8:41 AM MRN: [id number] Account #: [id number] Date of Birth: [birth year] Admit Type: Outpatient Age: 77 Room: [facility] ENDO ROOM 3 Gender: Female Note Status: Finalized Procedure:            Colonoscopy Indications:          Screening for colorectal malignant neoplasm Providers:            Benay Pike. [name] MD, MD Referring MD:         Tracie Harrier, MD (Referring MD) Medicines:            Propofol per Anesthesia Complications:        No immediate complications. Procedure:            Pre-Anesthesia Assessment:                       - The risks and benefits of the procedure and the                        sedation options and risks were discussed with the                        patient. All questions were answered and informed                        consent was obtained.                       - Patient identification and proposed procedure were                        verified prior to the procedure by the nurse. The                        procedure was verified in the procedure room.                       - ASA Grade Assessment: II - A patient with mild                        systemic disease.                       - After reviewing the risks and benefits, the patient                        was deemed in satisfactory condition to undergo the                        procedure.                       After obtaining informed consent, the colonoscope was                        passed under direct vision. Throughout the procedure,                        the patient's blood pressure, pulse, and oxygen  saturations were monitored continuously. The                        Colonoscope was introduced through the anus and                        advanced to the the cecum, identified by appendiceal                        orifice and ileocecal valve. The colonoscopy was                  performed without difficulty. The patient tolerated the                        procedure well. The quality of the bowel preparation                        was excellent. The ileocecal valve, appendiceal                        orifice, and rectum were photographed. Findings:      The perianal and digital rectal examinations were normal. Pertinent       negatives include normal sphincter tone and no palpable rectal lesions.      Multiple medium-mouthed diverticula were found in the left colon.      A 15 mm polyp was found in the distal sigmoid colon. The polyp was       pedunculated. The polyp was removed with a hot snare. Resection and       retrieval were complete.      Non-bleeding internal hemorrhoids were found during retroflexion. The       hemorrhoids were Grade I (internal hemorrhoids that do not prolapse).      The exam was otherwise without abnormality. Impression:           - Diverticulosis in the left colon.                       - One 15 mm polyp in the distal sigmoid colon, removed                        with a hot snare. Resected and retrieved.                       - Non-bleeding internal hemorrhoids.                       - The examination was otherwise normal. Recommendation:       - Patient has a contact number available for                        emergencies. The signs and symptoms of potential                        delayed complications were discussed with the patient.                        Return to normal activities tomorrow. Written discharge  instructions were provided to the patient.                       - Resume previous diet.                       - Continue present medications.                       - Await pathology results.                       - Repeat colonoscopy is recommended for surveillance.                        The colonoscopy date will be determined after pathology                        results from today's  exam become available for review.                       - Return to GI office PRN.                       - The findings and recommendations were discussed with                        the patient and their family. Procedure Code(s):    --- Professional ---                       [phone], Colonoscopy, flexible; with removal of tumor(s),                        polyp(s), or other lesion(s) by snare technique Diagnosis Code(s):    --- Professional ---                       Z12.11, Encounter for screening for malignant neoplasm                        of colon                       K64.0, First degree hemorrhoids                       D12.5, Benign neoplasm of sigmoid colon                       K57.30, Diverticulosis of large intestine without                        perforation or abscess without bleeding CPT copyright 2016 American Medical Association. All rights reserved. The codes documented in this report are preliminary and upon coder review may  be revised to meet current compliance requirements. Efrain Sella MD, MD 05/31/2017 9:14:50 AM This report has been signed electronically. Number of Addenda: 0 Note Initiated On: 05/31/2017 8:41 AM Scope Withdrawal Time: 0 hours 9 minutes 16 seconds  Total Procedure Duration: 0 hours 13 minutes 30 seconds       [facility]

## 2017-05-31 NOTE — Anesthesia Preprocedure Evaluation (Signed)
Anesthesia Evaluation  Patient identified by MRN, date of birth, ID band Patient awake    Reviewed: Allergy & Precautions, NPO status , Patient's Chart, lab work & pertinent test results  History of Anesthesia Complications Negative for: history of anesthetic complications  Airway Mallampati: III  TM Distance: >3 FB Neck ROM: Full    Dental no notable dental hx.    Pulmonary sleep apnea (has CPAP but does not wear it) , neg COPD,    breath sounds clear to auscultation- rhonchi (-) wheezing      Cardiovascular Exercise Tolerance: Good (-) hypertension(-) CAD, (-) Past MI, (-) Cardiac Stents and (-) CABG  Rhythm:Regular Rate:Normal - Systolic murmurs and - Diastolic murmurs    Neuro/Psych Anxiety negative neurological ROS     GI/Hepatic Neg liver ROS, GERD  ,  Endo/Other  diabetes, Oral Hypoglycemic Agents  Renal/GU negative Renal ROS     Musculoskeletal  (+) Arthritis ,   Abdominal (+) - obese,   Peds  Hematology negative hematology ROS (+)   Anesthesia Other Findings Past Medical History: No date: Allergic rhinitis No date: Anxiety No date: Arthritis 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, left No date: Diabetes mellitus without complication (HCC) No date: GERD (gastroesophageal reflux disease) No date: History of kidney stones No date: Sleep apnea     Comment:  OSA---HAS C-PAP BUT   DOES NOT USE   Reproductive/Obstetrics                             Anesthesia Physical Anesthesia Plan  ASA: II  Anesthesia Plan: General   Post-op Pain Management:    Induction: Intravenous  PONV Risk Score and Plan: 2 and Propofol infusion  Airway Management Planned: Natural Airway  Additional Equipment:   Intra-op Plan:   Post-operative Plan:   Informed Consent: I have reviewed the patients History and Physical, chart, labs and discussed the procedure including the risks, benefits and alternatives for the  proposed anesthesia with the patient or authorized representative who has indicated his/her understanding and acceptance.   Dental advisory given  Plan Discussed with: CRNA and Anesthesiologist  Anesthesia Plan Comments:         Anesthesia Quick Evaluation

## 2017-05-31 NOTE — Interval H&P Note (Signed)
History and Physical Interval Note:  05/31/2017 8:41 AM  Gabriella [name]  has presented today for surgery, with the diagnosis of COLON CANCER SCREENING  The various methods of treatment have been discussed with the patient and family. After consideration of risks, benefits and other options for treatment, the patient has consented to  Procedure(s): COLONOSCOPY WITH PROPOFOL (N/A) as a surgical intervention .  The patient's history has been reviewed, patient examined, no change in status, stable for surgery.  I have reviewed the patient's chart and labs.  Questions were answered to the patient's satisfaction.     [place], [place]

## 2017-05-31 NOTE — Anesthesia Postprocedure Evaluation (Signed)
Anesthesia Post Note  Patient: Gabriella [name]  Procedure(s) Performed: COLONOSCOPY WITH PROPOFOL (N/A )  Patient location during evaluation: Endoscopy Anesthesia Type: General Level of consciousness: awake and alert and oriented Pain management: pain level controlled Vital Signs Assessment: post-procedure vital signs reviewed and stable Respiratory status: spontaneous breathing, nonlabored ventilation and respiratory function stable Cardiovascular status: blood pressure returned to baseline and stable Postop Assessment: no signs of nausea or vomiting Anesthetic complications: no     Last Vitals:  Vitals:   05/31/17 0731 05/31/17 0916  BP: 135/67   Pulse: 71 76  Resp: 18 14  Temp: (!) 35.8 C 36.4 C  SpO2: 100% 97%    Last Pain:  Vitals:   05/31/17 0916  TempSrc:   PainSc: (P) Asleep                 Sharrie Self

## 2017-06-01 ENCOUNTER — Encounter: Payer: Self-pay | Admitting: Internal Medicine

## 2017-06-05 LAB — SURGICAL PATHOLOGY

## 2017-07-04 ENCOUNTER — Encounter: Payer: Self-pay | Admitting: General Surgery

## 2019-01-16 ENCOUNTER — Ambulatory Visit: Payer: Medicare Other

## 2019-01-16 ENCOUNTER — Ambulatory Visit
Admission: EM | Admit: 2019-01-16 | Discharge: 2019-01-16 | Disposition: A | Discharge status: Home / Self Care | Payer: Medicare Other | Attending: Family Medicine | Admitting: Family Medicine

## 2019-01-16 ENCOUNTER — Other Ambulatory Visit: Payer: Self-pay

## 2019-01-16 DIAGNOSIS — S82045A Nondisplaced comminuted fracture of left patella, initial encounter for closed fracture: Secondary | ICD-10-CM

## 2019-01-16 DIAGNOSIS — W01198A Fall on same level from slipping, tripping and stumbling with subsequent striking against other object, initial encounter: Secondary | ICD-10-CM

## 2019-01-16 MED ORDER — HYDROCODONE-ACETAMINOPHEN 5-325 MG PO TABS: 1.0000 | ORAL_TABLET | Freq: Three times a day (TID) | ORAL | 0 refills | Status: DC | PRN

## 2019-01-16 NOTE — Discharge Instructions (Signed)
Medication as prescribed.  Call [facility] for an appt with Dr. Rudene Christians on Friday (we discussed this).  Rest, elevate.  No weight bearing.  Take care  Dr. Lacinda Axon

## 2019-01-16 NOTE — ED Provider Notes (Signed)
[facility]    CSN: [id number] Arrival date & time: 01/16/19  1355  History   Chief Complaint Chief Complaint  Patient presents with  . Fall    HPI  78 year old female presents with the above complaint.  Patient reports that she slipped on a wet floor this morning in the bathroom.  She states that she fell directly on her left knee.  She reports extensive swelling and pain of the left knee.  Currently rates her pain is 9/10 in severity.  Patient also reports that she hit the right side of her jaw with that this is not currently bothering her.  No head injury.  Patient reports that she is unable to walk secondary to the pain and swelling.  No medications or interventions tried.  No relieving factors.  No other complaints.  PMH, Surgical Hx, Family Hx, Social History reviewed and updated as below.  Past Medical History:  Diagnosis Date  . Allergic rhinitis   . Anxiety   . Arthritis   . Deaf, left   . Diabetes mellitus without complication ([place])   . GERD (gastroesophageal reflux disease)   . History of kidney stones   . Sleep apnea    OSA---HAS C-PAP BUT   DOES NOT USE    Patient Active Problem List   Diagnosis Date Noted  . Anxiety 05/31/2016  . GERD (gastroesophageal reflux disease) 05/31/2016  . OSA (obstructive sleep apnea) 05/31/2016  . Type 2 diabetes mellitus without complication, without long-term current use of insulin ([place]) 05/27/2015  . Seasonal allergies 03/26/2014    Past Surgical History:  Procedure Laterality Date  . BREAST BIOPSY Right 2008   stereo-benign  . 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 BREAST CYST ASPIRATION Left   . BREAST LUMPECTOMY Left 12/13/2016  . BREAST LUMPECTOMY WITH NEEDLE LOCALIZATION Left 12/13/2016   Procedure: BREAST LUMPECTOMY WITH NEEDLE LOCALIZATION;  Surgeon: Leonie Green, MD;  Location: [facility] ORS;  Service: General;  Laterality: Left;  . COLONOSCOPY WITH PROPOFOL N/A 05/31/2017   Procedure: COLONOSCOPY WITH PROPOFOL;  Surgeon: [place], Benay Pike, MD;  Location: [facility] ENDOSCOPY;  Service: Gastroenterology;  Laterality: N/A;  . EYE SURGERY Bilateral    Cataract Extraction with IOL  . TONSILLECTOMY    . TUBAL LIGATION      OB History   No obstetric history on file.      Home Medications    Prior to Admission medications   Medication Sig Start Date End Date Taking? Authorizing Provider  escitalopram (LEXAPRO) 10 MG tablet Take 10 mg by mouth daily.   Yes [provider]  glimepiride (AMARYL) 4 MG tablet Take 4 mg by mouth daily with breakfast.   Yes [provider]  ibuprofen (ADVIL,MOTRIN) 200 MG tablet Take 400 mg by mouth every 8 (eight) hours as needed (for pain.).    Yes [provider]  metFORMIN (GLUCOPHAGE) 500 MG tablet Take 500 mg by mouth daily.   Yes [provider]  omeprazole (PRILOSEC) 20 MG capsule Take 20 mg by mouth daily.   Yes [provider]  acetaminophen (TYLENOL) 500 MG tablet Take 500-1,000 mg by mouth every 6 (six) hours as needed (for pain.).    [provider]  diclofenac (VOLTAREN) 75 MG EC tablet Take 1 tablet (75 mg total) by mouth 2 (two) times daily. Patient taking differently: Take 75 mg by mouth 2 (two) times daily as needed (for pain.).  04/19/16   Katy Apo,  NP  fluticasone (FLONASE) 50 MCG/ACT nasal spray Place 1 spray into both nostrils daily as needed (for allergies).     [provider]  HYDROcodone-acetaminophen (NORCO/VICODIN) 5-325 MG tablet Take 1 tablet by mouth every 8 (eight) hours as needed. 01/16/19   Coral Spikes, DO    Family History Family History  Problem Relation Age of Onset  . Diabetes Mother   . Dementia Mother   . Lung cancer Father     Social History Social History   Tobacco Use  . Smoking status: Never Smoker  . Smokeless tobacco: Never Used  Substance Use Topics  . Alcohol use: No  . Drug use: No     Allergies   Patient  has no known allergies.   Review of Systems Review of Systems  Constitutional: Negative.   Musculoskeletal:       Left knee pain, swelling.   Physical Exam Triage Vital Signs ED Triage Vitals  Enc Vitals Group     BP 01/16/19 1433 133/76     Pulse Rate 01/16/19 1433 83     Resp 01/16/19 1433 18     Temp 01/16/19 1433 98.2 F (36.8 C)     Temp Source 01/16/19 1433 Oral     SpO2 01/16/19 1433 97 %     Weight 01/16/19 1435 185 lb (83.9 kg)     Height --      Head Circumference --      Peak Flow --      Pain Score 01/16/19 1435 9     Pain Loc --      Pain Edu? --      Excl. in [place]? --    Updated Vital Signs BP 133/76 (BP Location: Left Arm)   Pulse 83   Temp 98.2 F (36.8 C) (Oral)   Resp 18   Wt 83.9 kg   SpO2 97%   BMI 28.98 kg/m   Visual Acuity Right Eye Distance:   Left Eye Distance:   Bilateral Distance:    Right Eye Near:   Left Eye Near:    Bilateral Near:     Physical Exam Vitals signs and nursing note reviewed.  Constitutional:      General: She is not in acute distress.    Appearance: Normal appearance. She is not ill-appearing.  HENT:     Head: Normocephalic and atraumatic.  Eyes:     General:        Right eye: No discharge.        Left eye: No discharge.     Conjunctiva/sclera: Conjunctivae normal.  Pulmonary:     Effort: Pulmonary effort is normal. No respiratory distress.  Musculoskeletal:     Comments: Knee: Left  Inspection: Markedly swollen left knee. Obvious effusion. Patient with exquisite tenderness to palpation diffusely. Decreased range of motion secondary to pain and swelling.  Unable to fully assess ligaments due to pain.   Skin:    General: Skin is warm.     Findings: Bruising present. No rash.  Neurological:     Mental Status: She is alert.  Psychiatric:        Mood and Affect: Mood normal.        Behavior: Behavior normal.    UC Treatments / Results  Labs (all labs ordered are listed, but only abnormal results are  displayed) Labs Reviewed - No data to display  EKG   Radiology Dg Knee Complete 4 Views Left  Result Date: 01/16/2019 CLINICAL DATA:  Fall, left knee pain/swelling EXAM: LEFT KNEE - COMPLETE 4+ VIEW COMPARISON:  None. FINDINGS: Transverse patellar fracture, mildly comminuted. Mild prepatellar soft tissue swelling. Moderate suprapatellar knee joint effusion. Mild tricompartmental degenerative changes. IMPRESSION: Transverse patellar fracture, mildly comminuted. Moderate suprapatellar knee joint effusion. Electronically Signed   By: Julian Hy M.D.   On: 01/16/2019 15:25    Procedures Procedures (including critical care time)  Medications Ordered in UC Medications - No data to display  Initial Impression / Assessment and Plan / UC Course  I have reviewed the triage vital signs and the nursing notes.  Pertinent labs & imaging results that were available during my care of the patient were reviewed by me and considered in my medical decision making (see chart for details).    78 year old female presents after suffering a fall.  X-ray revealed transverse mildly comminuted patella fracture.  Discussed with orthopedics, Dr. Rudene Christians.  He recommended immobilization and follow-up on Friday.  No need for urgent surgery at this time.  PRN Vicodin as needed.  Rest, elevation.  Patient placed in knee immobilizer.  [place] controlled substance database reviewed.  Patient could not be located.  Final Clinical Impressions(s) / UC Diagnoses   Final diagnoses:  Closed nondisplaced comminuted fracture of left patella, initial encounter     Discharge Instructions     Medication as prescribed.  Call [facility] for an appt with Dr. Rudene Christians on Friday (we discussed this).  Rest, elevate.  No weight bearing.  Take care  Dr. Lacinda Axon     ED Prescriptions    Medication Sig Dispense Auth. Provider   HYDROcodone-acetaminophen (NORCO/VICODIN) 5-325 MG tablet Take 1 tablet by mouth every 8 (eight)  hours as needed. 10 tablet Coral Spikes, DO     Controlled Substance Prescriptions [place] Controlled Substance Registry consulted? Yes, I have consulted the [place] Controlled Substances Registry for this patient, and feel the risk/benefit ratio today is favorable for proceeding with this prescription for a controlled substance.   Coral Spikes, [place] 01/16/19 1559

## 2019-01-16 NOTE — ED Triage Notes (Signed)
Pt states she fell in the shower this morning and now having pain and swelling in her left knee. States she unable to bear weight on it. Did take ibuprofen for the pain and unsure if it helped.

## 2019-03-18 ENCOUNTER — Ambulatory Visit: Payer: Medicare Other | Attending: Orthopedic Surgery | Admitting: Physical Therapy

## 2019-03-18 ENCOUNTER — Other Ambulatory Visit: Payer: Self-pay

## 2019-03-18 ENCOUNTER — Encounter: Payer: Self-pay | Admitting: Physical Therapy

## 2019-03-18 DIAGNOSIS — M6281 Muscle weakness (generalized): Secondary | ICD-10-CM | POA: Diagnosis present

## 2019-03-18 DIAGNOSIS — R269 Unspecified abnormalities of gait and mobility: Secondary | ICD-10-CM | POA: Diagnosis present

## 2019-03-18 DIAGNOSIS — M25662 Stiffness of left knee, not elsewhere classified: Secondary | ICD-10-CM

## 2019-03-18 NOTE — Patient Instructions (Signed)
Access Code: [email or web address]  URL: [email or web address]  Date: 03/18/2019  Prepared by: Dorcas Carrow   Exercises  Supine Quad Set - 10 reps - 2 sets - 5 seconds hold - 1x daily - 7x weekly  Supine Heel Slides - 10 reps - 2 sets - 5 seconds hold - 1x daily - 7x weekly  Straight Leg Raise - 10 reps - 2 sets - 1x daily - 7x weekly  Sidelying Hip Abduction - 10 reps - 2 sets - 1x daily - 7x weekly  Standing Heel Raise with Support - 10 reps - 2 sets - 1x daily - 7x weekly

## 2019-03-21 ENCOUNTER — Ambulatory Visit: Payer: Medicare Other | Admitting: Physical Therapy

## 2019-03-21 ENCOUNTER — Encounter: Payer: Self-pay | Admitting: Physical Therapy

## 2019-03-21 ENCOUNTER — Other Ambulatory Visit: Payer: Self-pay

## 2019-03-21 DIAGNOSIS — M6281 Muscle weakness (generalized): Secondary | ICD-10-CM

## 2019-03-21 DIAGNOSIS — M25662 Stiffness of left knee, not elsewhere classified: Secondary | ICD-10-CM | POA: Diagnosis not present

## 2019-03-21 DIAGNOSIS — R269 Unspecified abnormalities of gait and mobility: Secondary | ICD-10-CM

## 2019-03-21 NOTE — Therapy (Signed)
[facility] [address] [place], [place], [address] Phone: [phone]   Fax:  [phone]  Physical Therapy Treatment  Patient Details  Name: Gabriella [name] MRN: [id number] Date of Birth: [birth year] Referring Provider (PT): Dr. Hessie Knows   Encounter Date: 03/21/2019  PT End of Session - 03/21/19 0905    Visit Number  2    Number of Visits  8    Date for PT Re-Evaluation  04/15/19    Authorization - Visit Number  2    Authorization - Number of Visits  10    PT Start Time  0901    PT Stop Time  0948    PT Time Calculation (min)  47 min    Equipment Utilized During Treatment  Other (comment)   QC (instructed in proper use)   Activity Tolerance  Patient tolerated treatment well    Behavior During Therapy  [facility] for tasks assessed/performed       Past Medical History:  Diagnosis Date  . Allergic rhinitis   . Anxiety   . Arthritis   . Deaf, left   . Diabetes mellitus without complication ([place])   . GERD (gastroesophageal reflux disease)   . History of kidney stones   . Sleep apnea    OSA---HAS C-PAP BUT   DOES NOT USE    Past Surgical History:  Procedure Laterality Date  . BREAST BIOPSY Right 2008   stereo-benign  . 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 BREAST CYST ASPIRATION Left   . BREAST LUMPECTOMY Left 12/13/2016  . BREAST LUMPECTOMY WITH NEEDLE LOCALIZATION Left 12/13/2016   Procedure: BREAST LUMPECTOMY WITH NEEDLE LOCALIZATION;  Surgeon: Leonie Green, MD;  Location: [facility] ORS;  Service: General;  Laterality: Left;  . COLONOSCOPY WITH PROPOFOL N/A 05/31/2017   Procedure: COLONOSCOPY WITH PROPOFOL;  Surgeon: Toledo, Benay Pike, MD;  Location: [facility] ENDOS[facility];  Service: Gastroenterology;  Laterality: N/A;  . EYE SURGERY Bilateral    Cataract Extraction with IOL  . TONSILLECTOMY    . TUBAL LIGATION      There were no vitals filed for this visit.  Subjective Assessment - 03/21/19  0901    Subjective  Pt. reports no new complaints.  Pt. c/o 2-3/10 L knee discomfort at this time.  Pt. states she didn't do HEP since initial evaluation.  Pt. reports she is getting lazy.    Pertinent History  Son lives with pt.  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  Pt. reports she is fairly inactive.    Limitations  Walking;Standing    Diagnostic tests  X-ray    Patient Stated Goals  Increase L knee ROM/ strength to walk again.  Decrease pain.    Currently in Pain?  Yes    Pain Score  3     Pain Location  Knee    Pain Orientation  Left    Pain Descriptors / Indicators  Aching         There.ex.:  Nustep L4 10 min. B UE/LE.  Cuing to keep L knee in midline position.  Reviewed HEP.  Supine quad sets/ SLR/ knee to chest/ SAQ 20x each. Walking in //-bars (high marching/ lateral)- 6x  Manual tx.:  Supine L patellar mobs. (all planes)- grade II-III 3x20 sec. Each Supine distal quad. STM in knee flexion/ extension position   Pt. Instructed to ice L knee (decrease soreness)   PT Long Term Goals - 03/21/19 0830  PT LONG TERM GOAL #1   Title  Pt. will increase FOTO to 60 to improve functional mobility.    Baseline  Initial FOTO: 40    Time  4    Period  Weeks    Status  New    Target Date  04/15/19      PT LONG TERM GOAL #2   Title  Pt. will increase R knee AROM to WNL as compared to L knee to improve mobility.    Baseline  R knee AROM (0 to 136 deg.), L knee AROM (-2 to 128 deg.)- no PROM allowed at this time    Time  4    Period  Weeks    Status  New    Target Date  04/15/19      PT LONG TERM GOAL #3   Title  Pt. will ambulate with normalized gait pattern and no assistive on level surfaces safely.    Baseline  L antalgic gait pattern with inconsistent use of QC    Time  4    Period  Weeks    Status  New    Target Date  04/15/19      PT LONG TERM GOAL #4   Title  Pt. able to ascend/ descend 4 steps with recip. pattern and 1 handrail safely.    Baseline   Step to pattern on stairs with UE assist.    Time  4    Period  Weeks    Status  New    Target Date  04/15/19            Plan - 03/21/19 0905    Clinical Impression Statement  L knee discomfort with prolonged knee extension in supine position.  Pt. demonstrates a marked increase in L knee flexion after supine ther.ex.  PT reviewed HEP and instructed pt. in proper technique/ hold time.  Minimal discomfort with patellar mobs. today with hypomobility still present.  PT encouaged pt to be more compliance with HEP this weekend.  Pt. continues to use QC inconsistently while walking around PT clinic.    Stability/Clinical Decision Making  Stable/Uncomplicated    Clinical Decision Making  Moderate    Rehab Potential  Good    PT Frequency  2x / week    PT Duration  4 weeks    PT Treatment/Interventions  ADLs/Self Care Home Management;Cryotherapy;Electrical Stimulation;Moist Heat;Gait training;Stair training;Balance training;Therapeutic exercise;Therapeutic activities;Functional mobility training;Neuromuscular re-education;Patient/family education;Manual techniques;Passive range of motion    PT Next Visit Plan  Progress HEP/ L knee strengthening.    PT Home Exercise Plan  See HEP.  Access Code: [email or web address]       Patient will benefit from skilled therapeutic intervention in order to improve the following deficits and impairments:  Abnormal gait, Decreased balance, Decreased endurance, Difficulty walking, Hypomobility, Decreased range of motion, Decreased activity tolerance, Decreased strength, Impaired flexibility, Pain  Visit Diagnosis: Joint stiffness of knee, left  Muscle weakness (generalized)  Gait difficulty     Problem List Patient Active Problem List   Diagnosis Date Noted  . Anxiety 05/31/2016  . GERD (gastroesophageal reflux disease) 05/31/2016  . OSA (obstructive sleep apnea) 05/31/2016  . Type 2 diabetes mellitus without complication, without long-term current use of insulin  ([place]) 05/27/2015  . Seasonal allergies 03/26/2014   Pura Spice, PT, DPT # [phone] 03/22/2019, 2:58 PM  [facility] [address] [place], [place], [address] Phone: [phone]   Fax:  [phone]  Name: Gabriella [name] MRN: [id number] Date of Birth: 20-Aug-1940

## 2019-03-21 NOTE — Therapy (Signed)
[facility] [address] [place], [place], [address] Phone: [phone]   Fax:  [phone]  Physical Therapy Evaluation  Patient Details  Name: Gabriella [name] MRN: [id number] Date of Birth: 02/26/1941 Referring Provider (PT): Dr. Hessie [name]   Encounter Date: 03/18/2019   Treatment: 1 of 8.  Recert date: [id number] 1032 to 1121   Past Medical History:  Diagnosis Date  . Allergic rhinitis   . Anxiety   . Arthritis   . Deaf, left   . Diabetes mellitus without complication ([place])   . GERD (gastroesophageal reflux disease)   . History of kidney stones   . Sleep apnea    OSA---HAS C-PAP BUT   DOES NOT USE    Past Surgical History:  Procedure Laterality Date  . BREAST BIOPSY Right 2008   stereo-benign  . 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 BREAST CYST ASPIRATION Left   . BREAST LUMPECTOMY Left 12/13/2016  . BREAST LUMPECTOMY WITH NEEDLE LOCALIZATION Left 12/13/2016   Procedure: BREAST LUMPECTOMY WITH NEEDLE LOCALIZATION;  Surgeon: Gabriella Green, MD;  Location: [facility] ORS;  Service: General;  Laterality: Left;  . COLONOSCOPY WITH PROPOFOL N/A 05/31/2017   Procedure: COLONOSCOPY WITH PROPOFOL;  Surgeon: Toledo, Benay Pike, MD;  Location: [facility];  Service: Gastroenterology;  Laterality: N/A;  . EYE SURGERY Bilateral    Cataract Extraction with IOL  . TONSILLECTOMY    . TUBAL LIGATION      There were no vitals filed for this visit.   Pt. fell on L knee/ patella while walking in bathroom. L patella fracture (no surgery)/ required knee immobilizer. Pt. entered PT with use of QC on L side of body.   Son lives with pt. 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 Pt. reports she is fairly inactive.   No hip or ankle issues  L quad/hip flexion 4/5 MMT     [name] HEP   PT Education - 03/21/19 0821    Education Details  [name] HEP    Person(s) Educated  Patient    Methods   Explanation;Demonstration;Handout    Comprehension  Verbalized understanding;Returned demonstration          PT Long Term Goals - 03/21/19 0830      PT LONG TERM GOAL #1   Title  Pt. will increase Gabriella [name] to 60 to improve functional mobility.    Baseline  Initial Gabriella [name]: 40    Time  4    Period  Weeks    Status  New    Target Date  04/15/19      PT LONG TERM GOAL #2   Title  Pt. will increase R knee AROM to WNL as compared to L knee to improve mobility.    Baseline  R knee AROM (0 to 136 deg.), L knee AROM (-2 to 128 deg.)- no PROM allowed at this time    Time  4    Period  Weeks    Status  New    Target Date  04/15/19      PT LONG TERM GOAL #3   Title  Pt. will ambulate with normalized gait pattern and no assistive on level surfaces safely.    Baseline  L antalgic gait pattern with inconsistent use of QC    Time  4    Period  Weeks    Status  New    Target Date  04/15/19      PT LONG TERM  GOAL #4   Title  Pt. able to ascend/ descend 4 steps with recip. pattern and 1 handrail safely.    Baseline  Step to pattern on stairs with UE assist.    Time  4    Period  Weeks    Status  New    Target Date  04/15/19         Plan - 03/21/19 [id number]    Clinical Impression Statement  Pt. is a pleasant 78 y/o female s/p closed non-displaced comminuted fracture of L patella from fall at home in bathroom on 01/16/2019.  Pt. reports 4/10 L knee pain (aching) currently while walking into PT clinic.  R knee AROM (0 to 136 deg.), L knee AROM (-2 to 128 deg.)- no PROM allowed at this time per MD order.  R/L circumferential measurements: joint line (37.5/40 cm.), distal quad (39/40 cm.), mid-gastroc. (34.5/34 cm.).  Good R patellar mobility noted with mild hypomobility noted in L patella, esp. with inferior and medial glides.  Pt. ambulates with marked L antalgic gait pattern with use of QC.  Pt. instructed in proper sizing and use of QC for 2-point gait pattern.  Gabriella [name]: initial 40/ goal: 40.  Pt. will  benefit from skilled PT services to increase L knee ROM/ strengthening to improve pain-free mobility.    Stability/Clinical Decision Making  Stable/Uncomplicated    Clinical Decision Making  Low    Rehab Potential  Good    PT Frequency  2x / week    PT Duration  4 weeks    PT Treatment/Interventions  ADLs/Self Care Home Management;Cryotherapy;Electrical Stimulation;Moist Heat;Gait training;Stair training;Balance training;Therapeutic exercise;Therapeutic activities;Functional mobility training;Neuromuscular re-education;Patient/family education;Manual techniques;Passive range of motion    PT Next Visit Plan  Progress HEP/ L knee strengthening.    PT Home Exercise Plan  [name] HEP.  Access Code: [email or web address]       Patient will benefit from skilled therapeutic intervention in order to improve the following deficits and impairments:  Abnormal gait, Decreased balance, Decreased endurance, Difficulty walking, Hypomobility, Decreased range of motion, Decreased activity tolerance, Decreased strength, Impaired flexibility, Pain  Visit Diagnosis: Joint stiffness of knee, left  Muscle weakness (generalized)  Gait difficulty     Problem List Patient Active Problem List   Diagnosis Date Noted  . Anxiety 05/31/2016  . GERD (gastroesophageal reflux disease) 05/31/2016  . OSA (obstructive sleep apnea) 05/31/2016  . Type 2 diabetes mellitus without complication, without long-term current use of insulin ([place]) 05/27/2015  . Seasonal allergies 03/26/2014   Gabriella [name], PT, DPT # [phone] 03/21/2019, 8:34 AM  [facility] [facility] [address] [place], [place], [address] Phone: [phone]   Fax:  [phone]  Name: Gabriella [name] MRN: [id number] Date of Birth: [birth year]

## 2019-03-25 ENCOUNTER — Other Ambulatory Visit: Payer: Self-pay

## 2019-03-25 ENCOUNTER — Ambulatory Visit: Payer: Medicare Other | Admitting: Physical Therapy

## 2019-03-25 ENCOUNTER — Encounter: Payer: Self-pay | Admitting: Physical Therapy

## 2019-03-25 DIAGNOSIS — M25662 Stiffness of left knee, not elsewhere classified: Secondary | ICD-10-CM

## 2019-03-25 DIAGNOSIS — M6281 Muscle weakness (generalized): Secondary | ICD-10-CM

## 2019-03-25 DIAGNOSIS — R269 Unspecified abnormalities of gait and mobility: Secondary | ICD-10-CM

## 2019-03-25 NOTE — Therapy (Signed)
[facility] [address] [place], [place], [address] Phone: [phone]   Fax:  [phone]  Physical Therapy Treatment  Patient Details  Name: Gabriella [name] MRN: [id number] Date of Birth: 07-20-1940 Referring Provider (PT): Dr. Hessie Knows   Encounter Date: 03/25/2019  PT End of Session - 03/25/19 1025    Visit Number  3    Number of Visits  8    Date for PT Re-Evaluation  04/15/19    Authorization - Visit Number  3    Authorization - Number of Visits  10    PT Start Time  1013    PT Stop Time  1104    PT Time Calculation (min)  51 min    Equipment Utilized During Treatment  Other (comment)   QC (instructed in proper use)   Activity Tolerance  Patient tolerated treatment well    Behavior During Therapy  [facility] for tasks assessed/performed       Past Medical History:  Diagnosis Date  . Allergic rhinitis   . Anxiety   . Arthritis   . Deaf, left   . Diabetes mellitus without complication ([place])   . GERD (gastroesophageal reflux disease)   . History of kidney stones   . Sleep apnea    OSA---HAS C-PAP BUT   DOES NOT USE    Past Surgical History:  Procedure Laterality Date  . BREAST BIOPSY Right 2008   stereo-benign  . 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 BREAST CYST ASPIRATION Left   . BREAST LUMPECTOMY Left 12/13/2016  . BREAST LUMPECTOMY WITH NEEDLE LOCALIZATION Left 12/13/2016   Procedure: BREAST LUMPECTOMY WITH NEEDLE LOCALIZATION;  Surgeon: Leonie Green, MD;  Location: [facility] OR[facility];  Service: General;  Laterality: Left;  . COLONOSCOPY WITH PROPOFOL N/A 05/31/2017   Procedure: COLONOSCOPY WITH PROPOFOL;  Surgeon: Toledo, Benay Pike, MD;  Location: [facility];  Service: Gastroenterology;  Laterality: N/A;  . EYE SURGERY Bilateral    Cataract Extraction with IOL  . TONSILLECTOMY    . TUBAL LIGATION      There were no vitals filed for this visit.  Subjective Assessment - 03/25/19  1024    Subjective  Pt. states she had some knee pain over weekend but doing well this morning.  Pt. reports getting off sofa or stepping down out of back door still causes issues in L knee.    Pertinent History  Son lives with pt.  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  Pt. reports she is fairly inactive.    Limitations  Walking;Standing    Diagnostic tests  X-ray    Patient Stated Goals  Increase L knee ROM/ strength to walk again.  Decrease pain.    Currently in Pain?  Yes    Pain Score  2     Pain Location  Knee    Pain Orientation  Left        There.ex.:  Nustep L4 10 min. B UE/LE.  Cuing to keep L knee in midline position.  Warm-up.  Discussed weekend. Standing hip ex. (3#): flexion/ extension/ abduction/ heel raises/ knee flexion 20x each.  Light UE assist at //-bars with mirror feedback for technique.  Seated 3# ex: LAQ/ marching 20x.  L knee discomfort with full LAQ (modified)   Supine quad sets/ SLR/ knee to chest/ SAQ 20x each.  PT feedback with SLR/ assist Walking in //-bars (high marching/ lateral)- 6x Supine bridging 10x  Manual tx.:  Supine L patellar mobs. (all planes)- grade II-III 3x20 sec. Each Supine distal quad. STM in knee flexion/ extension position  Supine L knee A/AROM 5x with static holds.      PT Long Term Goals - 03/21/19 0830      PT LONG TERM GOAL #1   Title  Pt. will increase FOTO to 60 to improve functional mobility.    Baseline  Initial FOTO: 40    Time  4    Period  Weeks    Status  New    Target Date  04/15/19      PT LONG TERM GOAL #2   Title  Pt. will increase R knee AROM to WNL as compared to L knee to improve mobility.    Baseline  R knee AROM (0 to 136 deg.), L knee AROM (-2 to 128 deg.)- no PROM allowed at this time    Time  4    Period  Weeks    Status  New    Target Date  04/15/19      PT LONG TERM GOAL #3   Title  Pt. will ambulate with normalized gait pattern and no assistive on level surfaces safely.    Baseline   L antalgic gait pattern with inconsistent use of QC    Time  4    Period  Weeks    Status  New    Target Date  04/15/19      PT LONG TERM GOAL #4   Title  Pt. able to ascend/ descend 4 steps with recip. pattern and 1 handrail safely.    Baseline  Step to pattern on stairs with UE assist.    Time  4    Period  Weeks    Status  New    Target Date  04/15/19            Plan - 03/25/19 1026    Clinical Impression Statement  Pt. progressing to standing ther.ex. with light resistance.  Pt. able to stand from standard chair with minimal c/o pain but marked difficulty while trying to stand from lower/ soft surfaces.  Pt. benefits from light UE assist with sit to stands and standing ther.ex.  No balance issues during standing ther.ex./ walking in clinic today.  Marked increase in pts. ability to increase L knee flexion/ patellar mobility in supine position.    Stability/Clinical Decision Making  Stable/Uncomplicated    Clinical Decision Making  Low    Rehab Potential  Good    PT Frequency  2x / week    PT Duration  4 weeks    PT Treatment/Interventions  ADLs/Self Care Home Management;Cryotherapy;Electrical Stimulation;Moist Heat;Gait training;Stair training;Balance training;Therapeutic exercise;Therapeutic activities;Functional mobility training;Neuromuscular re-education;Patient/family education;Manual techniques;Passive range of motion    PT Next Visit Plan  Progress HEP/ L knee strengthening.    PT Home Exercise Plan  See HEP.  Access Code: [email or web address]       Patient will benefit from skilled therapeutic intervention in order to improve the following deficits and impairments:  Abnormal gait, Decreased balance, Decreased endurance, Difficulty walking, Hypomobility, Decreased range of motion, Decreased activity tolerance, Decreased strength, Impaired flexibility, Pain  Visit Diagnosis: Joint stiffness of knee, left  Muscle weakness (generalized)  Gait difficulty     Problem  List Patient Active Problem List   Diagnosis Date Noted  . Anxiety 05/31/2016  . GERD (gastroesophageal reflux disease) 05/31/2016  . OSA (obstructive sleep apnea) 05/31/2016  . Type 2 diabetes mellitus without complication, without  long-term current use of insulin ([place]) 05/27/2015  . Seasonal allergies 03/26/2014   Pura Spice, PT, DPT # [id number] 03/25/2019, 7:26 PM  [facility] [address] [place], [place], [address] Phone: [phone]   Fax:  [phone]  Name: Gabriella [name] MRN: [id number] Date of Birth: 11-Jul-1940

## 2019-03-27 ENCOUNTER — Encounter: Payer: Self-pay | Admitting: Physical Therapy

## 2019-03-27 ENCOUNTER — Ambulatory Visit: Payer: Medicare Other | Admitting: Physical Therapy

## 2019-03-27 ENCOUNTER — Other Ambulatory Visit: Payer: Self-pay

## 2019-03-27 DIAGNOSIS — M25662 Stiffness of left knee, not elsewhere classified: Secondary | ICD-10-CM

## 2019-03-27 DIAGNOSIS — R269 Unspecified abnormalities of gait and mobility: Secondary | ICD-10-CM

## 2019-03-27 DIAGNOSIS — M6281 Muscle weakness (generalized): Secondary | ICD-10-CM

## 2019-03-27 NOTE — Therapy (Signed)
[facility] [address] [place], [place], [address] Phone: [phone]   Fax:  [phone]  Physical Therapy Treatment  Patient Details  Name: Gabriella [name] MRN: [id number] Date of Birth: December 11, 1940 Referring Provider (PT): Dr. Hessie Knows   Encounter Date: 03/27/2019   Treatment: 4 of 8.  Recert date: [id number] 1025 to 1115   Past Medical History:  Diagnosis Date  . Allergic rhinitis   . Anxiety   . Arthritis   . Deaf, left   . Diabetes mellitus without complication ([place])   . GERD (gastroesophageal reflux disease)   . History of kidney stones   . Sleep apnea    OSA---HAS C-PAP BUT   DOES NOT USE    Past Surgical History:  Procedure Laterality Date  . BREAST BIOPSY Right 2008   stereo-benign  . 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 BREAST CYST ASPIRATION Left   . BREAST LUMPECTOMY Left 12/13/2016  . BREAST LUMPECTOMY WITH NEEDLE LOCALIZATION Left 12/13/2016   Procedure: BREAST LUMPECTOMY WITH NEEDLE LOCALIZATION;  Surgeon: Leonie Green, MD;  Location: [facility] OR[facility];  Service: General;  Laterality: Left;  . COLONOSCOPY WITH PROPOFOL N/A 05/31/2017   Procedure: COLONOSCOPY WITH PROPOFOL;  Surgeon: Toledo, Benay Pike, MD;  Location: [facility];  Service: Gastroenterology;  Laterality: N/A;  . EYE SURGERY Bilateral    Cataract Extraction with IOL  . TONSILLECTOMY    . TUBAL LIGATION      There were no vitals filed for this visit.     Pt. states she is doing okay but feels stiff in L knee while walking. Pt. states she is sitting on the couch too much and needs to get up more often. Pt. states she has been doing some cleaning at home and some painting.      There.ex.:  Nustep L4 10 min. B UE/LE. Warm-up/ no increase c/o pain reported.  Standing hip ex. (3#): flexion/ extension/ abduction/ heel raises/ knee flexion 20x each.   Seated 3# ex: LAQ/ marching 20x.  Pain tolerable range.   Supine quad sets/ SLR/ knee to chest/ SAQ 20x each.  Improved SLR today (light PT assist/ verbal cuing) Walking in //-bars (high marching/ lateral)- 6x Supine bridging 10x  Manual tx.:  Supine L patellar mobs.(all planes)- grade II-III 3x20 sec. Each Supine distal quad. STM in knee flexion/ extension position Supine L knee AA/PROM 5x with static holds.     PT Long Term Goals - 03/21/19 0830      PT LONG TERM GOAL #1   Title  Pt. will increase FOTO to 60 to improve functional mobility.    Baseline  Initial FOTO: 40    Time  4    Period  Weeks    Status  New    Target Date  04/15/19      PT LONG TERM GOAL #2   Title  Pt. will increase R knee AROM to WNL as compared to L knee to improve mobility.    Baseline  R knee AROM (0 to 136 deg.), L knee AROM (-2 to 128 deg.)- no PROM allowed at this time    Time  4    Period  Weeks    Status  New    Target Date  04/15/19      PT LONG TERM GOAL #3   Title  Pt. will ambulate with normalized gait pattern and no assistive on level surfaces safely.  Baseline  L antalgic gait pattern with inconsistent use of QC    Time  4    Period  Weeks    Status  New    Target Date  04/15/19      PT LONG TERM GOAL #4   Title  Pt. able to ascend/ descend 4 steps with recip. pattern and 1 handrail safely.    Baseline  Step to pattern on stairs with UE assist.    Time  4    Period  Weeks    Status  New    Target Date  04/15/19         Pt. walking into PT clinic with consistent gait pattern with slight decrease in R step length during L LE wt. bearing. R knee AROM (0 to 136 deg.), L knee AROM (-2 to 131 deg.)- Pt. Allowed to start PROM this week per MD order. L knee flexion PROM 136 deg. (no pain). Good balance/ LE stability with standing tasks and balance training.      Patient will benefit from skilled therapeutic intervention in order to improve the following deficits and impairments:  Abnormal gait, Decreased balance, Decreased  endurance, Difficulty walking, Hypomobility, Decreased range of motion, Decreased activity tolerance, Decreased strength, Impaired flexibility, Pain  Visit Diagnosis: Joint stiffness of knee, left  Muscle weakness (generalized)  Gait difficulty     Problem List Patient Active Problem List   Diagnosis Date Noted  . Anxiety 05/31/2016  . GERD (gastroesophageal reflux disease) 05/31/2016  . OSA (obstructive sleep apnea) 05/31/2016  . Type 2 diabetes mellitus without complication, without long-term current use of insulin ([place]) 05/27/2015  . Seasonal allergies 03/26/2014   Pura Spice, PT, DPT # [phone] 03/29/2019, 3:42 PM  [facility] [facility] [address] [place], [place], [address] Phone: [phone]   Fax:  [phone]  Name: Gabriella [name] MRN: [id number] Date of Birth: 1940-11-30

## 2019-04-02 ENCOUNTER — Other Ambulatory Visit: Payer: Self-pay | Admitting: Internal Medicine

## 2019-04-02 DIAGNOSIS — Z1231 Encounter for screening mammogram for malignant neoplasm of breast: Secondary | ICD-10-CM

## 2019-04-03 ENCOUNTER — Other Ambulatory Visit: Payer: Self-pay

## 2019-04-03 ENCOUNTER — Encounter: Payer: Self-pay | Admitting: Physical Therapy

## 2019-04-03 ENCOUNTER — Ambulatory Visit: Payer: Medicare Other | Admitting: Physical Therapy

## 2019-04-03 DIAGNOSIS — M6281 Muscle weakness (generalized): Secondary | ICD-10-CM

## 2019-04-03 DIAGNOSIS — M25662 Stiffness of left knee, not elsewhere classified: Secondary | ICD-10-CM | POA: Diagnosis not present

## 2019-04-03 DIAGNOSIS — R269 Unspecified abnormalities of gait and mobility: Secondary | ICD-10-CM

## 2019-04-03 NOTE — Therapy (Signed)
[facility] [address] [place], [place], [address] Phone: [phone]   Fax:  [phone]  Physical Therapy Treatment  Patient Details  Name: Gabriella [name] MRN: [id number] Date of Birth: 1941/04/03 Referring Provider (PT): Dr. Hessie Knows   Encounter Date: 04/03/2019  PT End of Session - 04/03/19 1116    Visit Number  5    Number of Visits  8    Date for PT Re-Evaluation  04/15/19    Authorization - Visit Number  5    Authorization - Number of Visits  10    PT Start Time  1114    PT Stop Time  1205    PT Time Calculation (min)  51 min    Equipment Utilized During Treatment  Other (comment)   QC (instructed in proper use)   Activity Tolerance  Patient tolerated treatment well    Behavior During Therapy  [facility] for tasks assessed/performed       Past Medical History:  Diagnosis Date  . Allergic rhinitis   . Anxiety   . Arthritis   . Deaf, left   . Diabetes mellitus without complication ([place])   . GERD (gastroesophageal reflux disease)   . History of kidney stones   . Sleep apnea    OSA---HAS C-PAP BUT   DOES NOT USE    Past Surgical History:  Procedure Laterality Date  . BREAST BIOPSY Right 2008   stereo-benign  . 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 BREAST CYST ASPIRATION Left   . BREAST LUMPECTOMY Left 12/13/2016  . BREAST LUMPECTOMY WITH NEEDLE LOCALIZATION Left 12/13/2016   Procedure: BREAST LUMPECTOMY WITH NEEDLE LOCALIZATION;  Surgeon: Leonie Green, MD;  Location: [facility] ORS;  Service: General;  Laterality: Left;  . COLONOSCOPY WITH PROPOFOL N/A 05/31/2017   Procedure: COLONOSCOPY WITH PROPOFOL;  Surgeon: Toledo, Benay Pike, MD;  Location: [facility] END[facility];  Service: Gastroenterology;  Laterality: N/A;  . EYE SURGERY Bilateral    Cataract Extraction with IOL  . TONSILLECTOMY    . TUBAL LIGATION      There were no vitals filed for this visit.  Subjective Assessment - 04/03/19  1114    Subjective  Pt. states she has been overdoing it the past couple days.  Pt. reports the increase in walking made it difficult to get out of bed.  Pt. reports soreness in knee when she sits down and has to get up or out of bed.  Pt. says Dr. Rudene Christians was happy with progress and X-ray looks good.    Pertinent History  Son lives with pt.  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  Pt. reports she is fairly inactive.    Limitations  Walking;Standing    Diagnostic tests  X-ray    Patient Stated Goals  Increase L knee ROM/ strength to walk again.  Decrease pain.    Currently in Pain?  No/denies       FOTO: initial 40/ today 47/ goal 60.    There.ex.:  Nustep L4 10 min. B UE/LE. Warm-up/ no increase c/o pain reported.  Supine quad sets/ SLR 20x each.PT assist required for proper SLR.  Hip flexor weakness Standing hip ex. (4#):flexion/ extension/ abduction/ heel raises/ knee flexion 20x each.  Seated 4# ex:LAQ/ marching 20x.Pain tolerable range.  Walking in //-bars with resisted gait 1BTB (high marching/ lateral)- 5x each direction  Neuro:  Walking outside in grass/ off curb/ descending ramp with no assistive device.  Cuing to increase hip flexion/ step length/ heel strike.       PT Long Term Goals - 03/21/19 0830      PT LONG TERM GOAL #1   Title  Pt. will increase FOTO to 60 to improve functional mobility.    Baseline  Initial FOTO: 40    Time  4    Period  Weeks    Status  New    Target Date  04/15/19      PT LONG TERM GOAL #2   Title  Pt. will increase R knee AROM to WNL as compared to L knee to improve mobility.    Baseline  R knee AROM (0 to 136 deg.), L knee AROM (-2 to 128 deg.)- no PROM allowed at this time    Time  4    Period  Weeks    Status  New    Target Date  04/15/19      PT LONG TERM GOAL #3   Title  Pt. will ambulate with normalized gait pattern and no assistive on level surfaces safely.    Baseline  L antalgic gait pattern with inconsistent  use of QC    Time  4    Period  Weeks    Status  New    Target Date  04/15/19      PT LONG TERM GOAL #4   Title  Pt. able to ascend/ descend 4 steps with recip. pattern and 1 handrail safely.    Baseline  Step to pattern on stairs with UE assist.    Time  4    Period  Weeks    Status  New    Target Date  04/15/19            Plan - 04/04/19 1125    Clinical Impression Statement  Pt. continues to work hard during PT tx. session and increase R knee ROM/ quad strengthening improving.  Pts. MD is happy with progress and patella fx. healing well as noted on x-ray.  Pt. has hip weakness/ fatigue with repeated SLR/ hip abduction.  Pt. requires occasional cuing to increase hip flexion/ foot clearnace/ heel strike while walking in the grass.  Pt. encouraged to continue with walking/ HEP compliance at home.    Stability/Clinical Decision Making  Stable/Uncomplicated    Clinical Decision Making  Low    Rehab Potential  Good    PT Frequency  2x / week    PT Duration  4 weeks    PT Treatment/Interventions  ADLs/Self Care Home Management;Cryotherapy;Electrical Stimulation;Moist Heat;Gait training;Stair training;Balance training;Therapeutic exercise;Therapeutic activities;Functional mobility training;Neuromuscular re-education;Patient/family education;Manual techniques;Passive range of motion    PT Next Visit Plan  Progress HEP/ L knee strengthening.    PT Home Exercise Plan  See HEP.  Access Code: [email or web address]       Patient will benefit from skilled therapeutic intervention in order to improve the following deficits and impairments:  Abnormal gait, Decreased balance, Decreased endurance, Difficulty walking, Hypomobility, Decreased range of motion, Decreased activity tolerance, Decreased strength, Impaired flexibility, Pain  Visit Diagnosis: Joint stiffness of knee, left  Muscle weakness (generalized)  Gait difficulty     Problem List Patient Active Problem List   Diagnosis Date Noted  .  Anxiety 05/31/2016  . GERD (gastroesophageal reflux disease) 05/31/2016  . OSA (obstructive sleep apnea) 05/31/2016  . Type 2 diabetes mellitus without complication, without long-term current use of insulin ([place]) 05/27/2015  . Seasonal allergies 03/26/2014   Pura Spice, PT,  DPT # [phone] 04/04/2019, 11:29 AM  [facility] [address] [place], [place], [address] Phone: [phone]   Fax:  [phone]  Name: Gabriella [name] MRN: [id number] Date of Birth: November 14, 1940

## 2019-04-08 ENCOUNTER — Other Ambulatory Visit: Payer: Self-pay

## 2019-04-08 ENCOUNTER — Ambulatory Visit: Payer: Medicare Other | Admitting: Physical Therapy

## 2019-04-08 ENCOUNTER — Encounter: Payer: Self-pay | Admitting: Physical Therapy

## 2019-04-08 DIAGNOSIS — M25662 Stiffness of left knee, not elsewhere classified: Secondary | ICD-10-CM | POA: Diagnosis not present

## 2019-04-08 DIAGNOSIS — M6281 Muscle weakness (generalized): Secondary | ICD-10-CM

## 2019-04-08 DIAGNOSIS — R269 Unspecified abnormalities of gait and mobility: Secondary | ICD-10-CM

## 2019-04-08 NOTE — Therapy (Signed)
[facility] [address] [place], [place], [address] Phone: [phone]   Fax:  [phone]  Physical Therapy Treatment  Patient Details  Name: Gabriella [name] MRN: [id number] Date of Birth: [birth year] Referring Provider (PT): Dr. Hessie Knows   Encounter Date: 04/08/2019  PT End of Session - 04/08/19 1225    Visit Number  6    Number of Visits  8    Date for PT Re-Evaluation  04/15/19    Authorization - Visit Number  6    Authorization - Number of Visits  10    PT Start Time  1112    PT Stop Time  1155    PT Time Calculation (min)  43 min    Equipment Utilized During Treatment  Other (comment)   QC (instructed in proper use)   Activity Tolerance  Patient tolerated treatment well    Behavior During Therapy  [facility] for tasks assessed/performed       Past Medical History:  Diagnosis Date  . Allergic rhinitis   . Anxiety   . Arthritis   . Deaf, left   . Diabetes mellitus without complication ([place])   . GERD (gastroesophageal reflux disease)   . History of kidney stones   . Sleep apnea    OSA---HAS C-PAP BUT   DOES NOT USE    Past Surgical History:  Procedure Laterality Date  . BREAST BIOPSY Right 2008   stereo-benign  . 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 BREAST CYST ASPIRATION Left   . BREAST LUMPECTOMY Left 12/13/2016  . BREAST LUMPECTOMY WITH NEEDLE LOCALIZATION Left 12/13/2016   Procedure: BREAST LUMPECTOMY WITH NEEDLE LOCALIZATION;  Surgeon: Leonie Green, MD;  Location: [facility];  Service: General;  Laterality: Left;  . COLONOSCOPY WITH PROPOFOL N/A 05/31/2017   Procedure: COLONOSCOPY WITH PROPOFOL;  Surgeon: Toledo, Benay Pike, MD;  Location: [facility];  Service: Gastroenterology;  Laterality: N/A;  . EYE SURGERY Bilateral    Cataract Extraction with IOL  . TONSILLECTOMY    . TUBAL LIGATION      There were no vitals filed for this visit.  Subjective Assessment - 04/08/19  1108    Subjective  Pt. states she was busy over the Thanksgiving weekend.  Pt. reports L knee would get sore as day progresses.  Pt. reports limited compliance with HEP (PT educated pt. on importance of quad strengthening ex.).    Pertinent History  Son lives with pt.  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  Pt. reports she is fairly inactive.    Limitations  Walking;Standing    Diagnostic tests  X-ray    Patient Stated Goals  Increase L knee ROM/ strength to walk again.  Decrease pain.    Currently in Pain?  No/denies         There.ex.:  Scifit L5 10 min. B UE/LE. Warm-up/ no increase c/o pain reported.  Seated 5# ex:LAQ/ marching 20x.Increase L knee extension with resistance.  Good patellar tracking   Standing hip ex.(5#):flexion/ extension/ abduction/ heel raises/ knee flexion 20x each.   Walking in //-bars (forward/ lateral) with 5# ankle wt.  Sit to stands from varying heights on blue mat table 5x (no UE assist)  Recip. Stairs (4x) with light UE assist.  Cuing to correct R hip position and prevent lateral step downs.    Walking outside while carrying purse on sidewalk/ ramp with consistent step pattern.    PT Long Term Goals -  04/08/19 1231      PT LONG TERM GOAL #1   Title  Pt. will increase FOTO to 60 to improve functional mobility.    Baseline  Initial FOTO: 40.  11/25: 47 (slight improvement)    Time  4    Period  Weeks    Status  Partially Met    Target Date  04/15/19      PT LONG TERM GOAL #2   Title  Pt. will increase R knee AROM to WNL as compared to L knee to improve mobility.    Baseline  R knee AROM (0 to 136 deg.), L knee AROM (-2 to 128 deg.)- no PROM allowed at this time    Time  4    Period  Weeks    Status  New    Target Date  04/15/19      PT LONG TERM GOAL #3   Title  Pt. will ambulate with normalized gait pattern and no assistive on level surfaces safely.    Baseline  L antalgic gait pattern with inconsistent use of QC    Time  4     Period  Weeks    Status  New    Target Date  04/15/19      PT LONG TERM GOAL #4   Title  Pt. able to ascend/ descend 4 steps with recip. pattern and 1 handrail safely.    Baseline  Step to pattern on stairs with UE assist.    Time  4    Period  Weeks    Status  New    Target Date  04/15/19            Plan - 04/08/19 1225    Clinical Impression Statement  Good seated L knee flexion/ extension noted prior to resisted ther.ex.  Moderate cuing to correct R hip position while descending stairs with recip. pattern.  No increase L knee pain but quad muscle weakness noted.  Pt. benefits from light use of handrails for balance/ safety.  Pt. able to complete full L knee extension in sitting with 5# wt. and no increase c/o pain.  Good patella tracking.  Pt. remains limited with sit to stands from regular height chair without UE assist and benefits from hips being slightly superior to knees with standing.    Stability/Clinical Decision Making  Stable/Uncomplicated    Clinical Decision Making  Low    Rehab Potential  Good    PT Frequency  2x / week    PT Duration  4 weeks    PT Treatment/Interventions  ADLs/Self Care Home Management;Cryotherapy;Electrical Stimulation;Moist Heat;Gait training;Stair training;Balance training;Therapeutic exercise;Therapeutic activities;Functional mobility training;Neuromuscular re-education;Patient/family education;Manual techniques;Passive range of motion    PT Next Visit Plan  Progress HEP/ L knee strengthening.    PT Home Exercise Plan  See HEP.  Access Code: [email or web address]       Patient will benefit from skilled therapeutic intervention in order to improve the following deficits and impairments:  Abnormal gait, Decreased balance, Decreased endurance, Difficulty walking, Hypomobility, Decreased range of motion, Decreased activity tolerance, Decreased strength, Impaired flexibility, Pain  Visit Diagnosis: Joint stiffness of knee, left  Muscle weakness  (generalized)  Gait difficulty     Problem List Patient Active Problem List   Diagnosis Date Noted  . Anxiety 05/31/2016  . GERD (gastroesophageal reflux disease) 05/31/2016  . OSA (obstructive sleep apnea) 05/31/2016  . Type 2 diabetes mellitus without complication, without long-term current use of insulin ([place]) 05/27/2015  .  Seasonal allergies 03/26/2014   Pura Spice, PT, DPT # [phone] 04/08/2019, 12:32 PM  [facility] [address] [place], [place], [address] Phone: [phone]   Fax:  [phone]  Name: Gabriella [name] MRN: [id number] Date of Birth: Dec 07, 1940

## 2019-04-10 ENCOUNTER — Other Ambulatory Visit: Payer: Self-pay

## 2019-04-10 ENCOUNTER — Ambulatory Visit: Payer: Medicare Other | Attending: Orthopedic Surgery | Admitting: Physical Therapy

## 2019-04-10 ENCOUNTER — Encounter: Payer: Self-pay | Admitting: Physical Therapy

## 2019-04-10 DIAGNOSIS — M6281 Muscle weakness (generalized): Secondary | ICD-10-CM | POA: Diagnosis present

## 2019-04-10 DIAGNOSIS — R269 Unspecified abnormalities of gait and mobility: Secondary | ICD-10-CM | POA: Insufficient documentation

## 2019-04-10 DIAGNOSIS — M25662 Stiffness of left knee, not elsewhere classified: Secondary | ICD-10-CM | POA: Insufficient documentation

## 2019-04-10 NOTE — Therapy (Signed)
[facility] [address] [place], [place], [address] Phone: [phone]   Fax:  [phone]  Physical Therapy Treatment  Patient Details  Name: Gabriella [name] MRN: [id number] Date of Birth: January 16, 1941 Referring Provider (PT): Dr. Hessie Knows   Encounter Date: 04/10/2019  PT End of Session - 04/10/19 1700    Visit Number  7    Number of Visits  8    Date for PT Re-Evaluation  04/15/19    Authorization - Visit Number  7    Authorization - Number of Visits  10    PT Start Time  6962    PT Stop Time  1157    PT Time Calculation (min)  46 min    Equipment Utilized During Treatment  Other (comment)   QC (instructed in proper use)   Activity Tolerance  Patient tolerated treatment well    Behavior During Therapy  [facility] for tasks assessed/performed       Past Medical History:  Diagnosis Date  . Allergic rhinitis   . Anxiety   . Arthritis   . Deaf, left   . Diabetes mellitus without complication ([place])   . GERD (gastroesophageal reflux disease)   . History of kidney stones   . Sleep apnea    OSA---HAS C-PAP BUT   DOES NOT USE    Past Surgical History:  Procedure Laterality Date  . BREAST BIOPSY Right 2008   stereo-benign  . 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 BREAST CYST ASPIRATION Left   . BREAST LUMPECTOMY Left 12/13/2016  . BREAST LUMPECTOMY WITH NEEDLE LOCALIZATION Left 12/13/2016   Procedure: BREAST LUMPECTOMY WITH NEEDLE LOCALIZATION;  Surgeon: Leonie Green, MD;  Location: [facility];  Service: General;  Laterality: Left;  . COLONOSCOPY WITH PROPOFOL N/A 05/31/2017   Procedure: COLONOSCOPY WITH PROPOFOL;  Surgeon: Toledo, Benay Pike, MD;  Location: [facility];  Service: Gastroenterology;  Laterality: N/A;  . EYE SURGERY Bilateral    Cataract Extraction with IOL  . TONSILLECTOMY    . TUBAL LIGATION      There were no vitals filed for this visit.  Subjective Assessment - 04/10/19 1659     Subjective  Pt. reports no new complaints with L knee.  Pt. entered PT upset about a recent family issue/ illness.    Pertinent History  Son lives with pt.  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  Pt. reports she is fairly inactive.    Limitations  Walking;Standing    Diagnostic tests  X-ray    Patient Stated Goals  Increase L knee ROM/ strength to walk again.  Decrease pain.    Currently in Pain?  No/denies             There.ex.:  Scifit L5 10 min. B UE/LE. Warm-up/ no increase c/o pain reported.  Walking in //-bars with upright posture/ alt. [place] taps with no UE assist.   Standing hip ex.(5#):flexion/ extension/ abduction/ heel raises/ knee flexion 20x each.   Walking in //-bars(forward/ lateral) with 5# ankle wt.  Supine SLR/ bicycles/ marching/ SAQ without bolster supports 10x each.   Supine L/R LE generalized stretching: piriformis/ quad/ hamstring/ gastroc 3x each (as tolerated).       PT Long Term Goals - 04/08/19 1231      PT LONG TERM GOAL #1   Title  Pt. will increase FOTO to 60 to improve functional mobility.    Baseline  Initial FOTO: 40.  11/25: 47 (slight improvement)    Time  4    Period  Weeks    Status  Partially Met    Target Date  04/15/19      PT LONG TERM GOAL #2   Title  Pt. will increase R knee AROM to WNL as compared to L knee to improve mobility.    Baseline  R knee AROM (0 to 136 deg.), L knee AROM (-2 to 128 deg.)- no PROM allowed at this time    Time  4    Period  Weeks    Status  New    Target Date  04/15/19      PT LONG TERM GOAL #3   Title  Pt. will ambulate with normalized gait pattern and no assistive on level surfaces safely.    Baseline  L antalgic gait pattern with inconsistent use of QC    Time  4    Period  Weeks    Status  New    Target Date  04/15/19      PT LONG TERM GOAL #4   Title  Pt. able to ascend/ descend 4 steps with recip. pattern and 1 handrail safely.    Baseline  Step to pattern on  stairs with UE assist.    Time  4    Period  Weeks    Status  New    Target Date  04/15/19            Plan - 04/10/19 1701    Clinical Impression Statement  PT focused on more LE stretching/ supine based ther.ex. today.  Pt. was upset about son's recent medical diagnosis.  Pt. increasing L knee extension/ flexion with good patella tracking.  Initial aspects of standing/ walking remain cautious with slight R antaligc gait pattern due to R hip soreness/ discomfort.  Pt. demonstrates more normalized gait pattern while walking around PT clinic/ hallway.  No change to HEP at this time.    Stability/Clinical Decision Making  Stable/Uncomplicated    Clinical Decision Making  Low    Rehab Potential  Good    PT Frequency  2x / week    PT Duration  4 weeks    PT Treatment/Interventions  ADLs/Self Care Home Management;Cryotherapy;Electrical Stimulation;Moist Heat;Gait training;Stair training;Balance training;Therapeutic exercise;Therapeutic activities;Functional mobility training;Neuromuscular re-education;Patient/family education;Manual techniques;Passive range of motion    PT Next Visit Plan  Progress HEP/ L knee strengthening.   Reassess R hip discomfort.    PT Home Exercise Plan  See HEP.  Access Code: [email or web address]       Patient will benefit from skilled therapeutic intervention in order to improve the following deficits and impairments:  Abnormal gait, Decreased balance, Decreased endurance, Difficulty walking, Hypomobility, Decreased range of motion, Decreased activity tolerance, Decreased strength, Impaired flexibility, Pain  Visit Diagnosis: Joint stiffness of knee, left  Muscle weakness (generalized)  Gait difficulty     Problem List Patient Active Problem List   Diagnosis Date Noted  . Anxiety 05/31/2016  . GERD (gastroesophageal reflux disease) 05/31/2016  . OSA (obstructive sleep apnea) 05/31/2016  . Type 2 diabetes mellitus without complication, without long-term current use  of insulin ([place]) 05/27/2015  . Seasonal allergies 03/26/2014   Pura Spice, PT, DPT # [phone] 04/10/2019, 5:10 PM  [facility] [address] [place], [place], [address] Phone: [phone]   Fax:  [phone]  Name: Gabriella [name] MRN: [id number] Date of Birth: Jan 07, 1941

## 2019-04-15 ENCOUNTER — Ambulatory Visit: Payer: Medicare Other | Admitting: Physical Therapy

## 2019-04-15 ENCOUNTER — Encounter: Payer: Self-pay | Admitting: Physical Therapy

## 2019-04-15 ENCOUNTER — Other Ambulatory Visit: Payer: Self-pay

## 2019-04-15 DIAGNOSIS — R269 Unspecified abnormalities of gait and mobility: Secondary | ICD-10-CM

## 2019-04-15 DIAGNOSIS — M6281 Muscle weakness (generalized): Secondary | ICD-10-CM

## 2019-04-15 DIAGNOSIS — M25662 Stiffness of left knee, not elsewhere classified: Secondary | ICD-10-CM

## 2019-04-15 NOTE — Therapy (Signed)
[facility] [address] [place], [place], [address] Phone: [phone]   Fax:  [phone]  Physical Therapy Treatment  Patient Details  Name: Gabriella [name] MRN: [id number] Date of Birth: 1941/01/04 Referring Provider (PT): Dr. Hessie Knows   Encounter Date: 04/15/2019  PT End of Session - 04/15/19 1106    Visit Number  8    Number of Visits  8    Date for PT Re-Evaluation  04/15/19    Authorization - Visit Number  8    Authorization - Number of Visits  10    PT Start Time  1104    PT Stop Time  1156    PT Time Calculation (min)  52 min    Equipment Utilized During Treatment  Other (comment)   QC (instructed in proper use)   Activity Tolerance  Patient tolerated treatment well    Behavior During Therapy  [facility] for tasks assessed/performed       Past Medical History:  Diagnosis Date  . Allergic rhinitis   . Anxiety   . Arthritis   . Deaf, left   . Diabetes mellitus without complication ([place])   . GERD (gastroesophageal reflux disease)   . History of kidney stones   . Sleep apnea    OSA---HAS C-PAP BUT   DOES NOT USE    Past Surgical History:  Procedure Laterality Date  . BREAST BIOPSY Right 2008   stereo-benign  . 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 BREAST CYST ASPIRATION Left   . BREAST LUMPECTOMY Left 12/13/2016  . BREAST LUMPECTOMY WITH NEEDLE LOCALIZATION Left 12/13/2016   Procedure: BREAST LUMPECTOMY WITH NEEDLE LOCALIZATION;  Surgeon: Leonie Green, MD;  Location: [facility] ORS;  Service: General;  Laterality: Left;  . COLONOSCOPY WITH PROPOFOL N/A 05/31/2017   Procedure: COLONOSCOPY WITH PROPOFOL;  Surgeon: Toledo, Benay Pike, MD;  Location: [facility] ENDOSCOPY;  Service: Gastroenterology;  Laterality: N/A;  . EYE SURGERY Bilateral    Cataract Extraction with IOL  . TONSILLECTOMY    . TUBAL LIGATION      There were no vitals filed for this visit.  Subjective Assessment - 04/15/19 1106     Subjective  Pt. states L knee pain is minimal but present.  Pt. did not give a subjective number.  Pt. states R hip is feeling better since last tx.  Pt. is trying not to sleep on R hip.    Pertinent History  Son lives with pt.  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  Pt. reports she is fairly inactive.    Limitations  Walking;Standing    Diagnostic tests  X-ray    Patient Stated Goals  Increase L knee ROM/ strength to walk again.  Decrease pain.    Currently in Pain?  Yes    Pain Location  Knee    Pain Orientation  Left            There.ex.:  Scifit L510 min. B UE/LE. Warm-up/ no increase c/o pain reported.  Standing hip ex.(5#):flexion/ extension/ abduction/ heel raises/ knee flexion 20x each.   Walking in //-bars(forward/ lateral) with 5# ankle wt.  Supine SLR/ marching/ SAQ with bolster 10x2 each.   Supine L/R LE generalized stretching: piriformis/ quad/ hamstring/ gastroc 3x each (as tolerated).   Tandem stance/gait (>10 sec.).  SLS (>5 sec.) without UE assist.   Reviewed HEP     PT Long Term Goals - 04/08/19 1231  PT LONG TERM GOAL #1   Title  Pt. will increase FOTO to 60 to improve functional mobility.    Baseline  Initial FOTO: 40.  11/25: 47 (slight improvement)    Time  4    Period  Weeks    Status  Partially Met    Target Date  04/15/19      PT LONG TERM GOAL #2   Title  Pt. will increase R knee AROM to WNL as compared to L knee to improve mobility.    Baseline  R knee AROM (0 to 136 deg.), L knee AROM (-2 to 128 deg.)- no PROM allowed at this time    Time  4    Period  Weeks    Status  New    Target Date  04/15/19      PT LONG TERM GOAL #3   Title  Pt. will ambulate with normalized gait pattern and no assistive on level surfaces safely.    Baseline  L antalgic gait pattern with inconsistent use of QC    Time  4    Period  Weeks    Status  New    Target Date  04/15/19      PT LONG TERM GOAL #4   Title  Pt. able to ascend/  descend 4 steps with recip. pattern and 1 handrail safely.    Baseline  Step to pattern on stairs with UE assist.    Time  4    Period  Weeks    Status  New    Target Date  04/15/19            Plan - 04/15/19 1107    Clinical Impression Statement  L patella crepitus noted with superior/inferior tracking during knee flexion and extension ex. in supine.  Pt. showing marked increase in tandem stance (>10 sec.) and SLS (>5 sec.) without UE assist.  No increase c/o knee pain with 5# LE ex. program but slight discomfort at end-range knee flexion.  No change to HEP.    Stability/Clinical Decision Making  Stable/Uncomplicated    Clinical Decision Making  Low    Rehab Potential  Good    PT Frequency  2x / week    PT Duration  4 weeks    PT Treatment/Interventions  ADLs/Self Care Home Management;Cryotherapy;Electrical Stimulation;Moist Heat;Gait training;Stair training;Balance training;Therapeutic exercise;Therapeutic activities;Functional mobility training;Neuromuscular re-education;Patient/family education;Manual techniques;Passive range of motion    PT Next Visit Plan  Progress HEP/ L knee strengthening.  CHECK goals next tx.    PT Home Exercise Plan  See HEP.  Access Code: [email or web address]       Patient will benefit from skilled therapeutic intervention in order to improve the following deficits and impairments:  Abnormal gait, Decreased balance, Decreased endurance, Difficulty walking, Hypomobility, Decreased range of motion, Decreased activity tolerance, Decreased strength, Impaired flexibility, Pain  Visit Diagnosis: Joint stiffness of knee, left  Muscle weakness (generalized)  Gait difficulty     Problem List Patient Active Problem List   Diagnosis Date Noted  . Anxiety 05/31/2016  . GERD (gastroesophageal reflux disease) 05/31/2016  . OSA (obstructive sleep apnea) 05/31/2016  . Type 2 diabetes mellitus without complication, without long-term current use of insulin ([place]) 05/27/2015   . Seasonal allergies 03/26/2014   Pura Spice, PT, DPT # [phone] 04/15/2019, 7:03 PM  [facility] [address] [place], [place], [address] Phone: [phone]   Fax:  [phone]  Name: Gabriella [name]  MRN: [id number] Date of Birth: May 21, 1940

## 2019-04-17 ENCOUNTER — Encounter: Payer: Self-pay | Admitting: Physical Therapy

## 2019-04-17 ENCOUNTER — Other Ambulatory Visit: Payer: Self-pay

## 2019-04-17 ENCOUNTER — Ambulatory Visit: Payer: Medicare Other | Admitting: Physical Therapy

## 2019-04-17 DIAGNOSIS — M6281 Muscle weakness (generalized): Secondary | ICD-10-CM

## 2019-04-17 DIAGNOSIS — R269 Unspecified abnormalities of gait and mobility: Secondary | ICD-10-CM

## 2019-04-17 DIAGNOSIS — M25662 Stiffness of left knee, not elsewhere classified: Secondary | ICD-10-CM | POA: Diagnosis not present

## 2019-04-17 NOTE — Therapy (Signed)
[facility] [address] [place], [place], [address] Phone: [phone]   Fax:  [phone]  Physical Therapy Treatment  Patient Details  Name: Gabriella [name] MRN: [id number] Date of Birth: 10-01-1940 Referring Provider (PT): Dr. Hessie Knows   Encounter Date: 04/17/2019  PT End of Session - 04/17/19 1224    Visit Number  9    Number of Visits  16    Date for PT Re-Evaluation  05/15/19    Authorization - Visit Number  1    Authorization - Number of Visits  10    PT Start Time  9798    PT Stop Time  9211    PT Time Calculation (min)  47 min    Equipment Utilized During Treatment  Other (comment)   QC (instructed in proper use)   Activity Tolerance  Patient tolerated treatment well    Behavior During Therapy  [facility] for tasks assessed/performed       Past Medical History:  Diagnosis Date  . Allergic rhinitis   . Anxiety   . Arthritis   . Deaf, left   . Diabetes mellitus without complication ([place])   . GERD (gastroesophageal reflux disease)   . History of kidney stones   . Sleep apnea    OSA---HAS C-PAP BUT   DOES NOT USE    Past Surgical History:  Procedure Laterality Date  . BREAST BIOPSY Right 2008   stereo-benign  . 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 BREAST CYST ASPIRATION Left   . BREAST LUMPECTOMY Left 12/13/2016  . BREAST LUMPECTOMY WITH NEEDLE LOCALIZATION Left 12/13/2016   Procedure: BREAST LUMPECTOMY WITH NEEDLE LOCALIZATION;  Surgeon: Leonie Green, MD;  Location: [facility] ORS;  Service: General;  Laterality: Left;  . COLONOSCOPY WITH PROPOFOL N/A 05/31/2017   Procedure: COLONOSCOPY WITH PROPOFOL;  Surgeon: Toledo, Benay Pike, MD;  Location: [facility];  Service: Gastroenterology;  Laterality: N/A;  . EYE SURGERY Bilateral    Cataract Extraction with IOL  . TONSILLECTOMY    . TUBAL LIGATION      There were no vitals filed for this visit.  Subjective Assessment - 04/17/19  1221    Subjective  Pt. reports no new issues.  Pt. c/o R knee discomfort while descending steps in PT clinic.  No c/o hip pain at this time.    Pertinent History  Son lives with pt.  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  Pt. reports she is fairly inactive.    Limitations  Walking;Standing    Diagnostic tests  X-ray    Patient Stated Goals  Increase L knee ROM/ strength to walk again.  Decrease pain.    Currently in Pain?  Yes    Pain Score  1     Pain Location  Knee    Pain Orientation  Left    Pain Descriptors / Indicators  Aching         [facility] PT Assessment - 04/17/19 0001      Assessment   Medical Diagnosis  Closed nondisplaced comminuted fracture of left patella with routine healing    Referring Provider (PT)  Dr. Hessie Knows    Onset Date/Surgical Date  01/16/19    Hand Dominance  Left      Prior Function   Level of Independence  Independent      Cognition   Overall Cognitive Status  Within Functional Limits for tasks assessed  There.ex.:  Scifit L510 min. B UE/LE. Warm-up.  Seated hip ex.(RTB) hip abduction/ marching20x each.  Walking in //-barswith RTB distal quad 5x forward/ lateral.  Mirror feedback  Recip. Stair climbing 3x each with UE assist.    Walking in //-bars with alt. UE/LE touches and braiding with no UE assist 6x (SBA/CGA for safety and verbal cuing).    STS 5x from chair (UE assist required)  Goal reassessment  Walking outside on grassy terrain/ curbs with SBA for cuing for heel strike       PT Long Term Goals - 04/17/19 1229      PT LONG TERM GOAL #1   Title  Pt. will increase FOTO to 60 to improve functional mobility.    Baseline  Initial FOTO: 40.  11/25: 47 (slight improvement)    Time  4    Period  Weeks    Status  Partially Met    Target Date  05/15/19      PT LONG TERM GOAL #2   Title  Pt. will increase R knee AROM to WNL as compared to L knee to improve mobility.    Baseline  R knee AROM (0 to  136 deg.), L knee AROM (-2 to 128 deg.)- no PROM allowed at this time.  On 12/9: L knee flexion >130 deg./ good extension in seated posture.    Time  4    Period  Weeks    Status  Partially Met    Target Date  05/15/19      PT LONG TERM GOAL #3   Title  Pt. will ambulate with normalized gait pattern and no assistive on level surfaces safely.    Baseline  L antalgic gait pattern with inconsistent use of QC.  On 12/9: marked improvement in gait pattern with slight L antalgic gait with initial steps after standing    Time  4    Period  Weeks    Status  Partially Met    Target Date  05/15/19      PT LONG TERM GOAL #4   Title  Pt. able to ascend/ descend 4 steps with recip. pattern and 1 handrail safely.    Baseline  Recip. pattern with L knee discomfort during eccentric muscle control    Time  4    Period  Weeks    Status  Partially Met    Target Date  05/15/19            Plan - 04/17/19 1226    Clinical Impression Statement  Pt. presents with good L knee flexion/ extension prior to ther.ex.  Slight increase in L knee discomfort/ crepitus while descending stairs with R LE.  Eccentric muscle control of L quad limited as compared to R and requires light UE assist for balance/ control.  Pt. able to complete full L knee extension in sitting with 5# wt. and no increase c/o pain. Good patella tracking.  Pt. remains limited with sit to stands from regular height chair without UE assist and benefits from hips being slightly superior to knees with standing.  See updated goals.    Stability/Clinical Decision Making  Stable/Uncomplicated    Clinical Decision Making  Low    Rehab Potential  Good    PT Frequency  2x / week    PT Duration  4 weeks    PT Treatment/Interventions  ADLs/Self Care Home Management;Cryotherapy;Electrical Stimulation;Moist Heat;Gait training;Stair training;Balance training;Therapeutic exercise;Therapeutic activities;Functional mobility training;Neuromuscular  re-education;Patient/family education;Manual techniques;Passive range of motion  PT Next Visit Plan  Progress HEP/ L knee strengthening.  2 more tx. sessions.    PT Home Exercise Plan  See HEP.  Access Code: [email or web address]       Patient will benefit from skilled therapeutic intervention in order to improve the following deficits and impairments:  Abnormal gait, Decreased balance, Decreased endurance, Difficulty walking, Hypomobility, Decreased range of motion, Decreased activity tolerance, Decreased strength, Impaired flexibility, Pain  Visit Diagnosis: Joint stiffness of knee, left  Muscle weakness (generalized)  Gait difficulty     Problem List Patient Active Problem List   Diagnosis Date Noted  . Anxiety 05/31/2016  . GERD (gastroesophageal reflux disease) 05/31/2016  . OSA (obstructive sleep apnea) 05/31/2016  . Type 2 diabetes mellitus without complication, without long-term current use of insulin ([place]) 05/27/2015  . Seasonal allergies 03/26/2014   Pura Spice, PT, DPT # [phone] 04/17/2019, 12:33 PM  [place] [facility] [address] [place], [place], [address] Phone: [phone]   Fax:  [phone]  Name: Gabriella [name] MRN: [id number] Date of Birth: [birth year]

## 2019-04-22 ENCOUNTER — Other Ambulatory Visit: Payer: Self-pay

## 2019-04-22 ENCOUNTER — Ambulatory Visit: Payer: Medicare Other | Admitting: Physical Therapy

## 2019-04-22 ENCOUNTER — Encounter: Payer: Self-pay | Admitting: Physical Therapy

## 2019-04-22 DIAGNOSIS — R269 Unspecified abnormalities of gait and mobility: Secondary | ICD-10-CM

## 2019-04-22 DIAGNOSIS — M6281 Muscle weakness (generalized): Secondary | ICD-10-CM

## 2019-04-22 DIAGNOSIS — M25662 Stiffness of left knee, not elsewhere classified: Secondary | ICD-10-CM

## 2019-04-22 NOTE — Therapy (Signed)
[place] [facility] [address] [place], [place], [address] Phone: [phone]   Fax:  [phone]  Physical Therapy Treatment  Patient Details  Name: Gabriella [name] MRN: [id number] Date of Birth: 27-Feb-1941 Referring Provider (PT): Dr. Hessie Knows   Encounter Date: 04/22/2019  PT End of Session - 04/22/19 1116    Visit Number  10    Number of Visits  16    Date for PT Re-Evaluation  05/15/19    Authorization - Visit Number  2    Authorization - Number of Visits  10    PT Start Time  1111    PT Stop Time  1201    PT Time Calculation (min)  50 min    Equipment Utilized During Treatment  Other (comment)   QC (instructed in proper use)   Activity Tolerance  Patient tolerated treatment well    Behavior During Therapy  [facility] for tasks assessed/performed       Past Medical History:  Diagnosis Date  . Allergic rhinitis   . Anxiety   . Arthritis   . Deaf, left   . Diabetes mellitus without complication ([place])   . GERD (gastroesophageal reflux disease)   . History of kidney stones   . Sleep apnea    OSA---HAS C-PAP BUT   DOES NOT USE    Past Surgical History:  Procedure Laterality Date  . BREAST BIOPSY Right 2008   stereo-benign  . 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 BREAST CYST ASPIRATION Left   . BREAST LUMPECTOMY Left 12/13/2016  . BREAST LUMPECTOMY WITH NEEDLE LOCALIZATION Left 12/13/2016   Procedure: BREAST LUMPECTOMY WITH NEEDLE LOCALIZATION;  Surgeon: Leonie Green, MD;  Location: [facility];  Service: General;  Laterality: Left;  . COLONOSCOPY WITH PROPOFOL N/A 05/31/2017   Procedure: COLONOSCOPY WITH PROPOFOL;  Surgeon: Toledo, Benay Pike, MD;  Location: [facility];  Service: Gastroenterology;  Laterality: N/A;  . EYE SURGERY Bilateral    Cataract Extraction with IOL  . TONSILLECTOMY    . TUBAL LIGATION      There were no vitals filed for this visit.  Subjective Assessment - 04/22/19  1759    Subjective  Pt. continues to report L knee discomfort especially with stairs/ wt. bearing/ increase knee flexion.    Pertinent History  Son lives with pt.  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  Pt. reports she is fairly inactive.    Limitations  Walking;Standing    Diagnostic tests  X-ray    Patient Stated Goals  Increase L knee ROM/ strength to walk again.  Decrease pain.    Currently in Pain?  Yes    Pain Score  2     Pain Location  Knee    Pain Orientation  Left           There.ex.:  Scifit L510 min. B UE/LE. Warm-up.  Walking in hallway working on stride length/ step pattern while maintaining a consistent BOSU.    Recip. Stair climbing 3x each with UE assist.    Walking in //-bars with alt. UE/LE touches and braiding with no UE assist 6x (SBA/CGA for safety and verbal cuing).    STS 5x from chair (UE assist required)  Supine LE ex. Program (see HEP)/ L patellar mobs. (superior/inferior)- discomfort      PT Long Term Goals - 04/17/19 1229      PT LONG TERM GOAL #1   Title  Pt. will  increase FOTO to 60 to improve functional mobility.    Baseline  Initial FOTO: 40.  11/25: 47 (slight improvement)    Time  4    Period  Weeks    Status  Partially Met    Target Date  05/15/19      PT LONG TERM GOAL #2   Title  Pt. will increase R knee AROM to WNL as compared to L knee to improve mobility.    Baseline  R knee AROM (0 to 136 deg.), L knee AROM (-2 to 128 deg.)- no PROM allowed at this time.  On 12/9: L knee flexion >130 deg./ good extension in seated posture.    Time  4    Period  Weeks    Status  Partially Met    Target Date  05/15/19      PT LONG TERM GOAL #3   Title  Pt. will ambulate with normalized gait pattern and no assistive on level surfaces safely.    Baseline  L antalgic gait pattern with inconsistent use of QC.  On 12/9: marked improvement in gait pattern with slight L antalgic gait with initial steps after standing    Time  4     Period  Weeks    Status  Partially Met    Target Date  05/15/19      PT LONG TERM GOAL #4   Title  Pt. able to ascend/ descend 4 steps with recip. pattern and 1 handrail safely.    Baseline  Recip. pattern with L knee discomfort during eccentric muscle control    Time  4    Period  Weeks    Status  Partially Met    Target Date  05/15/19            Plan - 04/22/19 1116    Clinical Impression Statement  L knee discomfort with superior/inferior patellar mobs.  Pt. progressing well with L knee ROM in supine/ standing posture.  Pt. remains limited with descending stairs/ eccentric quad control and requires UE assist.  Pt. returns to MD on 05/06/2019.    Stability/Clinical Decision Making  Stable/Uncomplicated    Clinical Decision Making  Low    Rehab Potential  Good    PT Frequency  2x / week    PT Duration  4 weeks    PT Treatment/Interventions  ADLs/Self Care Home Management;Cryotherapy;Electrical Stimulation;Moist Heat;Gait training;Stair training;Balance training;Therapeutic exercise;Therapeutic activities;Functional mobility training;Neuromuscular re-education;Patient/family education;Manual techniques;Passive range of motion    PT Next Visit Plan  Progress HEP/ L knee strengthening.  1 more tx. session/ MD f/u on 12/28.    PT Home Exercise Plan  See HEP.  Access Code: [email or web address]       Patient will benefit from skilled therapeutic intervention in order to improve the following deficits and impairments:  Abnormal gait, Decreased balance, Decreased endurance, Difficulty walking, Hypomobility, Decreased range of motion, Decreased activity tolerance, Decreased strength, Impaired flexibility, Pain  Visit Diagnosis: Joint stiffness of knee, left  Muscle weakness (generalized)  Gait difficulty     Problem List Patient Active Problem List   Diagnosis Date Noted  . Anxiety 05/31/2016  . GERD (gastroesophageal reflux disease) 05/31/2016  . OSA (obstructive sleep apnea) 05/31/2016   . Type 2 diabetes mellitus without complication, without long-term current use of insulin ([place]) 05/27/2015  . Seasonal allergies 03/26/2014   Pura Spice, PT, DPT # [phone] 04/22/2019, 6:04 PM  [place] [facility] [address] [place],  [place], [address] Phone: [phone]   Fax:  [phone]  Name: Gabriella [name] MRN: [id number] Date of Birth: 01-30-1941

## 2019-04-24 ENCOUNTER — Ambulatory Visit: Payer: Medicare Other | Admitting: Physical Therapy

## 2019-04-24 ENCOUNTER — Other Ambulatory Visit: Payer: Self-pay

## 2019-04-24 DIAGNOSIS — M25662 Stiffness of left knee, not elsewhere classified: Secondary | ICD-10-CM

## 2019-04-24 DIAGNOSIS — R269 Unspecified abnormalities of gait and mobility: Secondary | ICD-10-CM

## 2019-04-24 DIAGNOSIS — M6281 Muscle weakness (generalized): Secondary | ICD-10-CM

## 2019-04-24 NOTE — Therapy (Addendum)
[facility] [address] [place], [place], [address] Phone: [phone]   Fax:  [phone]  Physical Therapy Treatment  Patient Details  Name: Gabriella [name] MRN: [id number] Date of Birth: 12-10-1940 Referring Provider (PT): Dr. Hessie Knows   Encounter Date: 04/24/2019   Treatment: 11 of 16.  Recert date: 11/11/1949 9326 to 46   Past Medical History:  Diagnosis Date  . Allergic rhinitis   . Anxiety   . Arthritis   . Deaf, left   . Diabetes mellitus without complication ([place])   . GERD (gastroesophageal reflux disease)   . History of kidney stones   . Sleep apnea    OSA---HAS C-PAP BUT   DOES NOT USE    Past Surgical History:  Procedure Laterality Date  . BREAST BIOPSY Right 2008   stereo-benign  . 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 BREAST CYST ASPIRATION Left   . BREAST LUMPECTOMY Left 12/13/2016  . BREAST LUMPECTOMY WITH NEEDLE LOCALIZATION Left 12/13/2016   Procedure: BREAST LUMPECTOMY WITH NEEDLE LOCALIZATION;  Surgeon: Leonie Green, MD;  Location: [facility] ORS;  Service: General;  Laterality: Left;  . COLONOSCOPY WITH PROPOFOL N/A 05/31/2017   Procedure: COLONOSCOPY WITH PROPOFOL;  Surgeon: Toledo, Benay Pike, MD;  Location: [facility] ENDOSCOPY;  Service: Gastroenterology;  Laterality: N/A;  . EYE SURGERY Bilateral    Cataract Extraction with IOL  . TONSILLECTOMY    . TUBAL LIGATION      There were no vitals filed for this visit.  Subjective Assessment - 04/28/19 0859    Subjective  Pt. reports no new compliants.  Pt. continues to have L knee discomfort with ascending/descending stairs and increase knee flexion.    Pertinent History  Son lives with pt.  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  Pt. reports she is fairly inactive.    Limitations  Walking;Standing    Diagnostic tests  X-ray    Patient Stated Goals  Increase L knee ROM/ strength to walk again.  Decrease pain.     Currently in Pain?  Yes    Pain Score  2     Pain Location  Knee    Pain Orientation  Left    Pain Descriptors / Indicators  Aching         There.ex.:  Scifit L510 min. B UE/LE. Warm-up/ discussed walking and HEP  TG knee flexion 30x (as tolerated)  Reviewed PT goals/ L knee AROM and MMT.    Walking forward/ backwards/ lateral in //-bars with increase knee flexion/ step pattern.  Supine LE ex. Program (reviewed HEP)/ L patellar mobs. (superior/inferior).      PT Long Term Goals - 04/28/19 0915      PT LONG TERM GOAL #1   Title  Pt. will increase FOTO to 60 to improve functional mobility.    Baseline  Initial FOTO: 40.  11/25: 47 (slight improvement)    Time  4    Period  Weeks    Status  Partially Met    Target Date  04/24/19      PT LONG TERM GOAL #2   Title  Pt. will increase R knee AROM to WNL as compared to L knee to improve mobility.    Baseline  R knee AROM (0 to 136 deg.), L knee AROM (-2 to 128 deg.)- no PROM allowed at this time.  On 12/9: L knee flexion >130 deg./ good extension in seated posture.   12/16:  see note    Time  4    Period  Weeks    Status  Achieved    Target Date  04/24/19      PT LONG TERM GOAL #3   Title  Pt. will ambulate with normalized gait pattern and no assistive on level surfaces safely.    Baseline  L antalgic gait pattern with inconsistent use of QC.  On 12/9: marked improvement in gait pattern with slight L antalgic gait with initial steps after standing    Time  4    Period  Weeks    Status  Achieved    Target Date  04/24/19      PT LONG TERM GOAL #4   Title  Pt. able to ascend/ descend 4 steps with recip. pattern and 1 handrail safely.    Baseline  Recip. pattern with L knee discomfort during eccentric muscle control.  12/16: pt. demonstrates improved ability to descend stairs but L patella discomfort.    Time  4    Period  Weeks    Status  Achieved    Target Date  04/24/19            Plan - 04/28/19 0901     Clinical Impression Statement  Pt. has progressed well towards all goals.  Pt. continues to have slight discomfort in L knee with ascending/ descending stairs (eccenric quad control) and during prolonged walking.  Pt. presents with good L knee AROM (-1 to 131 deg.) in supine position and strength.  Pt. ambulates with more normalized gait pattern over past several weeks.  Pt. will continue with daily walking/ HEP and instructed to contact PT if any increase L knee symptoms or functional limitations.  Discharge from PT at this time.    Stability/Clinical Decision Making  Stable/Uncomplicated    Clinical Decision Making  Low    Rehab Potential  Good    PT Frequency  2x / week    PT Duration  4 weeks    PT Treatment/Interventions  ADLs/Self Care Home Management;Cryotherapy;Electrical Stimulation;Moist Heat;Gait training;Stair training;Balance training;Therapeutic exercise;Therapeutic activities;Functional mobility training;Neuromuscular re-education;Patient/family education;Manual techniques;Passive range of motion    PT Next Visit Plan  Discharge.  Pt. will call PT after MD f/u on 05/06/2019.    PT Home Exercise Plan  See HEP.  Access Code: [email or web address]       Patient will benefit from skilled therapeutic intervention in order to improve the following deficits and impairments:  Abnormal gait, Decreased balance, Decreased endurance, Difficulty walking, Hypomobility, Decreased range of motion, Decreased activity tolerance, Decreased strength, Impaired flexibility, Pain  Visit Diagnosis: Joint stiffness of knee, left  Muscle weakness (generalized)  Gait difficulty     Problem List Patient Active Problem List   Diagnosis Date Noted  . Anxiety 05/31/2016  . GERD (gastroesophageal reflux disease) 05/31/2016  . OSA (obstructive sleep apnea) 05/31/2016  . Type 2 diabetes mellitus without complication, without long-term current use of insulin ([place]) 05/27/2015  . Seasonal allergies 03/26/2014    Pura Spice, PT, DPT # [id number] 04/28/2019, 9:17 AM  [facility] [address] [place], [place], [address] Phone: [phone]   Fax:  [phone]  Name: Gabriella [name] MRN: [id number] Date of Birth: [birth year]

## 2019-04-28 ENCOUNTER — Encounter: Payer: Self-pay | Admitting: Physical Therapy

## 2019-09-23 DIAGNOSIS — J302 Other seasonal allergic rhinitis: Secondary | ICD-10-CM | POA: Diagnosis not present

## 2019-09-23 DIAGNOSIS — K219 Gastro-esophageal reflux disease without esophagitis: Secondary | ICD-10-CM | POA: Diagnosis not present

## 2019-09-23 DIAGNOSIS — G4733 Obstructive sleep apnea (adult) (pediatric): Secondary | ICD-10-CM | POA: Diagnosis not present

## 2019-09-23 DIAGNOSIS — R8290 Unspecified abnormal findings in urine: Secondary | ICD-10-CM | POA: Diagnosis not present

## 2019-09-23 DIAGNOSIS — E1165 Type 2 diabetes mellitus with hyperglycemia: Secondary | ICD-10-CM | POA: Diagnosis not present

## 2019-09-23 DIAGNOSIS — Z1231 Encounter for screening mammogram for malignant neoplasm of breast: Secondary | ICD-10-CM | POA: Diagnosis not present

## 2019-09-23 DIAGNOSIS — F419 Anxiety disorder, unspecified: Secondary | ICD-10-CM | POA: Diagnosis not present

## 2019-09-23 DIAGNOSIS — S82035A Nondisplaced transverse fracture of left patella, initial encounter for closed fracture: Secondary | ICD-10-CM | POA: Diagnosis not present

## 2019-09-30 DIAGNOSIS — Z6831 Body mass index (BMI) 31.0-31.9, adult: Secondary | ICD-10-CM | POA: Diagnosis not present

## 2019-09-30 DIAGNOSIS — E663 Overweight: Secondary | ICD-10-CM | POA: Diagnosis not present

## 2019-09-30 DIAGNOSIS — E1165 Type 2 diabetes mellitus with hyperglycemia: Secondary | ICD-10-CM | POA: Diagnosis not present

## 2019-09-30 DIAGNOSIS — J302 Other seasonal allergic rhinitis: Secondary | ICD-10-CM | POA: Diagnosis not present

## 2019-09-30 DIAGNOSIS — I10 Essential (primary) hypertension: Secondary | ICD-10-CM | POA: Diagnosis not present

## 2019-09-30 DIAGNOSIS — D649 Anemia, unspecified: Secondary | ICD-10-CM | POA: Diagnosis not present

## 2019-09-30 DIAGNOSIS — K219 Gastro-esophageal reflux disease without esophagitis: Secondary | ICD-10-CM | POA: Diagnosis not present

## 2019-09-30 DIAGNOSIS — E119 Type 2 diabetes mellitus without complications: Secondary | ICD-10-CM | POA: Diagnosis not present

## 2019-09-30 DIAGNOSIS — N390 Urinary tract infection, site not specified: Secondary | ICD-10-CM | POA: Diagnosis not present

## 2019-09-30 DIAGNOSIS — F329 Major depressive disorder, single episode, unspecified: Secondary | ICD-10-CM | POA: Diagnosis not present

## 2020-04-01 DIAGNOSIS — Z6831 Body mass index (BMI) 31.0-31.9, adult: Secondary | ICD-10-CM | POA: Diagnosis not present

## 2020-04-01 DIAGNOSIS — R8290 Unspecified abnormal findings in urine: Secondary | ICD-10-CM | POA: Diagnosis not present

## 2020-04-01 DIAGNOSIS — J302 Other seasonal allergic rhinitis: Secondary | ICD-10-CM | POA: Diagnosis not present

## 2020-04-01 DIAGNOSIS — N3946 Mixed incontinence: Secondary | ICD-10-CM | POA: Diagnosis not present

## 2020-04-01 DIAGNOSIS — K219 Gastro-esophageal reflux disease without esophagitis: Secondary | ICD-10-CM | POA: Diagnosis not present

## 2020-04-01 DIAGNOSIS — R457 State of emotional shock and stress, unspecified: Secondary | ICD-10-CM | POA: Diagnosis not present

## 2020-04-01 DIAGNOSIS — E1165 Type 2 diabetes mellitus with hyperglycemia: Secondary | ICD-10-CM | POA: Diagnosis not present

## 2020-04-01 DIAGNOSIS — F419 Anxiety disorder, unspecified: Secondary | ICD-10-CM | POA: Diagnosis not present

## 2020-04-01 DIAGNOSIS — F3341 Major depressive disorder, recurrent, in partial remission: Secondary | ICD-10-CM | POA: Diagnosis not present

## 2020-04-01 DIAGNOSIS — G4733 Obstructive sleep apnea (adult) (pediatric): Secondary | ICD-10-CM | POA: Diagnosis not present

## 2020-04-08 ENCOUNTER — Other Ambulatory Visit: Payer: Self-pay | Admitting: Internal Medicine

## 2020-04-08 DIAGNOSIS — R32 Unspecified urinary incontinence: Secondary | ICD-10-CM | POA: Diagnosis not present

## 2020-04-08 DIAGNOSIS — R5383 Other fatigue: Secondary | ICD-10-CM | POA: Diagnosis not present

## 2020-04-08 DIAGNOSIS — K219 Gastro-esophageal reflux disease without esophagitis: Secondary | ICD-10-CM | POA: Diagnosis not present

## 2020-04-08 DIAGNOSIS — F329 Major depressive disorder, single episode, unspecified: Secondary | ICD-10-CM | POA: Diagnosis not present

## 2020-04-08 DIAGNOSIS — D649 Anemia, unspecified: Secondary | ICD-10-CM | POA: Diagnosis not present

## 2020-04-08 DIAGNOSIS — E119 Type 2 diabetes mellitus without complications: Secondary | ICD-10-CM | POA: Diagnosis not present

## 2020-04-08 DIAGNOSIS — Z0000 Encounter for general adult medical examination without abnormal findings: Secondary | ICD-10-CM | POA: Diagnosis not present

## 2020-04-08 DIAGNOSIS — Z1231 Encounter for screening mammogram for malignant neoplasm of breast: Secondary | ICD-10-CM

## 2020-09-30 DIAGNOSIS — F419 Anxiety disorder, unspecified: Secondary | ICD-10-CM | POA: Diagnosis not present

## 2020-09-30 DIAGNOSIS — N390 Urinary tract infection, site not specified: Secondary | ICD-10-CM | POA: Diagnosis not present

## 2020-09-30 DIAGNOSIS — R8290 Unspecified abnormal findings in urine: Secondary | ICD-10-CM | POA: Diagnosis not present

## 2020-09-30 DIAGNOSIS — Z0000 Encounter for general adult medical examination without abnormal findings: Secondary | ICD-10-CM | POA: Diagnosis not present

## 2020-09-30 DIAGNOSIS — R5381 Other malaise: Secondary | ICD-10-CM | POA: Diagnosis not present

## 2020-09-30 DIAGNOSIS — F3341 Major depressive disorder, recurrent, in partial remission: Secondary | ICD-10-CM | POA: Diagnosis not present

## 2020-09-30 DIAGNOSIS — K219 Gastro-esophageal reflux disease without esophagitis: Secondary | ICD-10-CM | POA: Diagnosis not present

## 2020-09-30 DIAGNOSIS — E1165 Type 2 diabetes mellitus with hyperglycemia: Secondary | ICD-10-CM | POA: Diagnosis not present

## 2020-09-30 DIAGNOSIS — R5383 Other fatigue: Secondary | ICD-10-CM | POA: Diagnosis not present

## 2020-09-30 DIAGNOSIS — D649 Anemia, unspecified: Secondary | ICD-10-CM | POA: Diagnosis not present

## 2020-10-07 DIAGNOSIS — K219 Gastro-esophageal reflux disease without esophagitis: Secondary | ICD-10-CM | POA: Diagnosis not present

## 2020-10-07 DIAGNOSIS — Z79899 Other long term (current) drug therapy: Secondary | ICD-10-CM | POA: Diagnosis not present

## 2020-10-07 DIAGNOSIS — Z0000 Encounter for general adult medical examination without abnormal findings: Secondary | ICD-10-CM | POA: Diagnosis not present

## 2020-10-07 DIAGNOSIS — E119 Type 2 diabetes mellitus without complications: Secondary | ICD-10-CM | POA: Diagnosis not present

## 2020-10-07 DIAGNOSIS — N390 Urinary tract infection, site not specified: Secondary | ICD-10-CM | POA: Diagnosis not present

## 2020-10-07 DIAGNOSIS — J302 Other seasonal allergic rhinitis: Secondary | ICD-10-CM | POA: Diagnosis not present

## 2020-10-07 DIAGNOSIS — E1165 Type 2 diabetes mellitus with hyperglycemia: Secondary | ICD-10-CM | POA: Diagnosis not present

## 2020-10-07 DIAGNOSIS — F3341 Major depressive disorder, recurrent, in partial remission: Secondary | ICD-10-CM | POA: Diagnosis not present

## 2020-10-29 ENCOUNTER — Emergency Department
Admission: EM | Admit: 2020-10-29 | Discharge: 2020-10-29 | Disposition: A | Discharge status: Home / Self Care | Payer: Medicare HMO | Attending: Emergency Medicine | Admitting: Emergency Medicine

## 2020-10-29 ENCOUNTER — Emergency Department: Payer: Medicare HMO

## 2020-10-29 ENCOUNTER — Encounter: Payer: Self-pay | Admitting: Emergency Medicine

## 2020-10-29 DIAGNOSIS — M25572 Pain in left ankle and joints of left foot: Secondary | ICD-10-CM | POA: Diagnosis not present

## 2020-10-29 DIAGNOSIS — S99912A Unspecified injury of left ankle, initial encounter: Secondary | ICD-10-CM | POA: Diagnosis not present

## 2020-10-29 DIAGNOSIS — Z5321 Procedure and treatment not carried out due to patient leaving prior to being seen by health care provider: Secondary | ICD-10-CM | POA: Diagnosis not present

## 2020-10-29 NOTE — ED Triage Notes (Signed)
Pt twisted left ankle when walking onto porch today and since has had difficulty baring weight as well as swelling and pain to left ankle/foot.

## 2020-10-30 ENCOUNTER — Encounter: Payer: Self-pay | Admitting: Emergency Medicine

## 2020-10-30 ENCOUNTER — Other Ambulatory Visit: Payer: Self-pay

## 2020-10-30 ENCOUNTER — Ambulatory Visit
Admission: EM | Admit: 2020-10-30 | Discharge: 2020-10-30 | Disposition: A | Discharge status: Home / Self Care | Payer: Medicare HMO | Attending: Physician Assistant | Admitting: Physician Assistant

## 2020-10-30 DIAGNOSIS — M25572 Pain in left ankle and joints of left foot: Secondary | ICD-10-CM

## 2020-10-30 DIAGNOSIS — S93402A Sprain of unspecified ligament of left ankle, initial encounter: Secondary | ICD-10-CM

## 2020-10-30 NOTE — ED Triage Notes (Signed)
Patient states that she twisted her left ankle yesterday.  Patient states that she went to the ED and had an x-ray done but did not stay due to their wait time.  Patient states that her pain is better.

## 2020-10-30 NOTE — ED Provider Notes (Signed)
[facility]    CSN: [id number] Arrival date & time: 10/30/20  1219      History   Chief Complaint Chief Complaint  Patient presents with   Ankle Pain    left    HPI Gabriella [name] is a 80 y.o. female presenting for left ankle pain since yesterday.  Patient states that she tripped and inverted her ankle.  She says that she went to the emergency department and had x-rays done but waited about 8 hours and then left.  Patient says she applied ice to her ankle overnight in throughout today and the swelling has gone down and the pain has improved some.  She says currently most of her pain is of the lateral ankle.  She admits to increased pain when trying to bear weight.  No numbness or tingling.  No recurrent falls.  Patient says that she does have some balance issues patient has not taken any medication for her pain.  She is concerned about underlying fractures.  No other complaints.  HPI  Past Medical History:  Diagnosis Date   Allergic rhinitis    Anxiety    Arthritis    Deaf, left    Diabetes mellitus without complication ([place])    GERD (gastroesophageal reflux disease)    History of kidney stones    Sleep apnea    OSA---HAS C-PAP BUT   DOES NOT USE    Patient Active Problem List   Diagnosis Date Noted   Anxiety 05/31/2016   GERD (gastroesophageal reflux disease) 05/31/2016   OSA (obstructive sleep apnea) 05/31/2016   Type 2 diabetes mellitus without complication, without long-term current use of insulin ([place]) 05/27/2015   Seasonal allergies 03/26/2014    Past Surgical History:  Procedure Laterality Date   BREAST BIOPSY Right 2008   stereo-benign   BREAST BIOPSY Left 05/17/2016   usual ductal hyperplasia   BREAST CYST ASPIRATION Left    BREAST LUMPECTOMY Left 12/13/2016   BREAST LUMPECTOMY WITH NEEDLE LOCALIZATION Left 12/13/2016   Procedure: BREAST LUMPECTOMY WITH NEEDLE LOCALIZATION;  Surgeon: Leonie Green, MD;  Location: [facility] ORS;  Service:  General;  Laterality: Left;   COLONOSCOPY WITH PROPOFOL N/A 05/31/2017   Procedure: COLONOSCOPY WITH PROPOFOL;  Surgeon: Toledo, Benay Pike, MD;  Location: [facility] ENDOS[facility];  Service: Gastroenterology;  Laterality: N/A;   EYE SURGERY Bilateral    Cataract Extraction with IOL   TONSILLECTOMY     TUBAL LIGATION      OB History   No obstetric history on file.      Home Medications    Prior to Admission medications   Medication Sig Start Date End Date Taking? Authorizing Provider  escitalopram (LEXAPRO) 10 MG tablet Take 10 mg by mouth daily.   Yes [provider]  fluticasone (FLONASE) 50 MCG/ACT nasal spray Place 1 spray into both nostrils daily as needed (for allergies).    Yes [provider]  glimepiride (AMARYL) 4 MG tablet Take 4 mg by mouth daily with breakfast.   Yes [provider]  metFORMIN (GLUCOPHAGE) 500 MG tablet Take 500 mg by mouth daily.   Yes [provider]  omeprazole (PRILOSEC) 20 MG capsule Take 20 mg by mouth daily.   Yes [provider]  acetaminophen (TYLENOL) 500 MG tablet Take 500-1,000 mg by mouth every 6 (six) hours as needed (for pain.).    [provider]  diclofenac (VOLTAREN) 75 MG EC tablet Take 1 tablet (75 mg total) by mouth 2 (two)  times daily. Patient taking differently: Take 75 mg by mouth 2 (two) times daily as needed (for pain.).  04/19/16   Katy Apo, NP  HYDROcodone-acetaminophen (NORCO/VICODIN) 5-325 MG tablet Take 1 tablet by mouth every 8 (eight) hours as needed. 01/16/19   Coral Spikes, DO  ibuprofen (ADVIL,MOTRIN) 200 MG tablet Take 400 mg by mouth every 8 (eight) hours as needed (for pain.).     [provider]    Family History Family History  Problem Relation Age of Onset   Diabetes Mother    Dementia Mother    Lung cancer Father     Social History Social History   Tobacco Use   Smoking status: Never   Smokeless tobacco: Never  Vaping Use   Vaping Use: Never  used  Substance Use Topics   Alcohol use: No   Drug use: No     Allergies   Patient has no known allergies.   Review of Systems Review of Systems  Musculoskeletal:  Positive for arthralgias, gait problem and joint swelling.  Skin:  Negative for color change and wound.  Neurological:  Negative for weakness and numbness.    Physical Exam Triage Vital Signs ED Triage Vitals  Enc Vitals Group     BP 10/30/20 1244 (!) 136/91     Pulse Rate 10/30/20 1244 88     Resp 10/30/20 1244 14     Temp 10/30/20 1244 98 F (36.7 C)     Temp Source 10/30/20 1244 Oral     SpO2 10/30/20 1244 99 %     Weight 10/30/20 1242 184 lb 15.5 oz (83.9 kg)     Height 10/30/20 1242 5\' 7"  (1.702 m)     Head Circumference --      Peak Flow --      Pain Score 10/30/20 1241 6     Pain Loc --      Pain Edu? --      Excl. in [place]? --    No data found.  Updated Vital Signs BP (!) 136/91 (BP Location: Left Arm)   Pulse 88   Temp 98 F (36.7 C) (Oral)   Resp 14   Ht 5\' 7"  (1.702 m)   Wt 184 lb 15.5 oz (83.9 kg)   SpO2 99%   BMI 28.97 kg/m        Physical Exam Vitals and nursing note reviewed.  Constitutional:      General: She is not in acute distress.    Appearance: Normal appearance. She is not ill-appearing or toxic-appearing.  HENT:     Head: Normocephalic and atraumatic.  Eyes:     General: No scleral icterus.       Right eye: No discharge.        Left eye: No discharge.     Conjunctiva/sclera: Conjunctivae normal.  Cardiovascular:     Rate and Rhythm: Normal rate and regular rhythm.     Pulses: Normal pulses.     Heart sounds: Normal heart sounds.  Pulmonary:     Effort: Pulmonary effort is normal. No respiratory distress.  Musculoskeletal:     Cervical back: Neck supple.     Left ankle: Swelling (mild/moderate swelling lateral ankle) present. Tenderness present over the lateral malleolus and ATF ligament. Decreased range of motion.  Skin:    General: Skin is dry.   Neurological:     General: No focal deficit present.     Mental Status: She is alert. Mental status is at  baseline.     Motor: No weakness.     Gait: Gait abnormal.  Psychiatric:        Mood and Affect: Mood normal.        Behavior: Behavior normal.        Thought Content: Thought content normal.     UC Treatments / Results  Labs (all labs ordered are listed, but only abnormal results are displayed) Labs Reviewed - No data to display  EKG   Radiology DG Ankle Complete Left  Result Date: 10/29/2020 CLINICAL DATA:  80 year old female with fall and trauma to the left foot. EXAM: LEFT ANKLE COMPLETE - 3+ VIEW; LEFT FOOT - COMPLETE 3+ VIEW COMPARISON:  None. FINDINGS: There is no acute fracture or dislocation. The bones are osteopenic. The ankle mortise is intact. Mild soft tissue edema. IMPRESSION: Negative. Electronically Signed   By: Anner Crete M.D.   On: 10/29/2020 22:15   DG Foot Complete Left  Result Date: 10/29/2020 CLINICAL DATA:  80 year old female with fall and trauma to the left foot. EXAM: LEFT ANKLE COMPLETE - 3+ VIEW; LEFT FOOT - COMPLETE 3+ VIEW COMPARISON:  None. FINDINGS: There is no acute fracture or dislocation. The bones are osteopenic. The ankle mortise is intact. Mild soft tissue edema. IMPRESSION: Negative. Electronically Signed   By: Anner Crete M.D.   On: 10/29/2020 22:15    Procedures Procedures (including critical care time)  Medications Ordered in UC Medications - No data to display  Initial Impression / Assessment and Plan / UC Course  I have reviewed the triage vital signs and the nursing notes.  Pertinent labs & imaging results that were available during my care of the patient were reviewed by me and considered in my medical decision making (see chart for details).  80 year old female presenting for left ankle pain and swelling following an injury yesterday.   Patient did go to [facility] and had x-rays performed.  I was able to view the  images from the x-ray and also the reports which are negative for any fractures.  I did review this with patient.  Advised patient she has a sprain of her left ankle which may take a week or 2 or sometimes longer to get better.  Encourage supportive care with RICE.  Patient provided with supportive ankle brace.  Advise she can take Tylenol for pain and low-dose Aleve for Advil if needed.  Advised to follow-up with Ortho for any acute worsening of symptoms or if not better in the next 2 weeks.  Final Clinical Impressions(s) / UC Diagnoses   Final diagnoses:  Sprain of left ankle, unspecified ligament, initial encounter  Acute left ankle pain     Discharge Instructions      SPRAIN: X-rays normal from last night. Wear the brace. Stressed avoiding painful activities . Reviewed RICE guidelines. Use medications as directed, including NSAIDs. If no NSAIDs have been prescribed for you today, you may take low dose Aleve or Motrin over the counter. May use Tylenol in between doses of NSAIDs.  If no improvement in the next 1-2 weeks, f/u with PCP or return to our office for reexamination, and please feel free to call or return at any time for any questions or concerns you may have and we will be happy to help you!         ED Prescriptions   None    PDMP not reviewed this encounter.   Danton Clap, PA-C 10/30/20 1320

## 2020-10-30 NOTE — Discharge Instructions (Addendum)
SPRAIN: X-rays normal from last night. Wear the brace. Stressed avoiding painful activities . Reviewed RICE guidelines. Use medications as directed, including NSAIDs. If no NSAIDs have been prescribed for you today, you may take low dose Aleve or Motrin over the counter. May use Tylenol in between doses of NSAIDs.  If no improvement in the next 1-2 weeks, f/u with PCP or return to our office for reexamination, and please feel free to call or return at any time for any questions or concerns you may have and we will be happy to help you!

## 2021-03-09 ENCOUNTER — Other Ambulatory Visit: Payer: Self-pay

## 2021-03-09 ENCOUNTER — Ambulatory Visit
Admission: EM | Admit: 2021-03-09 | Discharge: 2021-03-09 | Disposition: A | Discharge status: Home / Self Care | Payer: Medicare HMO | Attending: Emergency Medicine | Admitting: Emergency Medicine

## 2021-03-09 DIAGNOSIS — R0981 Nasal congestion: Secondary | ICD-10-CM

## 2021-03-09 DIAGNOSIS — R051 Acute cough: Secondary | ICD-10-CM | POA: Diagnosis not present

## 2021-03-09 MED ORDER — AZITHROMYCIN 250 MG PO TABS: 250.0000 mg | ORAL_TABLET | Freq: Every day | ORAL | 0 refills | Status: DC

## 2021-03-09 MED ORDER — ALBUTEROL SULFATE HFA 108 (90 BASE) MCG/ACT IN AERS: 2.0000 | INHALATION_SPRAY | RESPIRATORY_TRACT | 0 refills | Status: DC | PRN

## 2021-03-09 MED ORDER — GUAIFENESIN-DM 100-10 MG/5ML PO SYRP: 10.0000 mL | ORAL_SOLUTION | ORAL | 1 refills | Status: DC | PRN

## 2021-03-09 NOTE — ED Triage Notes (Signed)
Pt reports cough and congestion starting over the weekend. Copious nasal drainage and now blowing green from her nose. No fever.

## 2021-03-09 NOTE — Discharge Instructions (Signed)
Take azithromycin 2 pills on the first day then 1 pill for the remaining time, this is to cover any infection  You may use the cough syrup every 4 hours as needed to help with the congestion and coughing  You may use the albuterol inhaler 2 puffs every 4 hours to help With shortness of breath or wheezing  At any point if your breathing worsens please go to the nearest emergency department for further evaluation  If your symptoms persist but do not worsen you may return to urgent care to be evaluated  Please follow-up with your primary doctor for your loss of speech for further evaluation

## 2021-03-09 NOTE — ED Provider Notes (Signed)
[facility]    CSN: [id number] Arrival date & time: 03/09/21  1010      History   Chief Complaint Chief Complaint  Patient presents with   Cough   Nasal Congestion    HPI FARHA DANO is a 80 y.o. female.   Patient presents with productive cough, increased shortness of breath and nasal congestion for 3 days. No known sick contacts. Attempted use of mucinex with minimal relief. No respiratory or cardiac history.  Denies fever, chills, body aches, wheezing, chest pain or tightness, sore throat, abdominal pain, nausea, vomiting, diarrhea.   Patient endorses episode of slurred speech and inability to speak 1 month ago lasting for approximately 5 minutes before resolution.  Has not occurred again and had never occurred prior to admit.  Did not receive follow-up.  Currently denies any headaches, dizziness, lightheadedness, blurred vision, floaters, weakness, slurred speech, difficulty swallowing  Past Medical History:  Diagnosis Date   Allergic rhinitis    Anxiety    Arthritis    Deaf, left    Diabetes mellitus without complication ([place])    GERD (gastroesophageal reflux disease)    History of kidney stones    Sleep apnea    OSA---HAS C-PAP BUT   DOES NOT USE    Patient Active Problem List   Diagnosis Date Noted   Anxiety 05/31/2016   GERD (gastroesophageal reflux disease) 05/31/2016   OSA (obstructive sleep apnea) 05/31/2016   Type 2 diabetes mellitus without complication, without long-term current use of insulin ([place]) 05/27/2015   Seasonal allergies 03/26/2014    Past Surgical History:  Procedure Laterality Date   BREAST BIOPSY Right 2008   stereo-benign   BREAST BIOPSY Left 05/17/2016   usual ductal hyperplasia   BREAST CYST ASPIRATION Left    BREAST LUMPECTOMY Left 12/13/2016   BREAST LUMPECTOMY WITH NEEDLE LOCALIZATION Left 12/13/2016   Procedure: BREAST LUMPECTOMY WITH NEEDLE LOCALIZATION;  Surgeon: Leonie Green, MD;  Location: [facility] ORS;   Service: General;  Laterality: Left;   COLONOSCOPY WITH PROPOFOL N/A 05/31/2017   Procedure: COLONOSCOPY WITH PROPOFOL;  Surgeon: [place], Benay Pike, MD;  Location: [facility] ENDOS[facility];  Service: Gastroenterology;  Laterality: N/A;   EYE SURGERY Bilateral    Cataract Extraction with IOL   TONSILLECTOMY     TUBAL LIGATION      OB History   No obstetric history on file.      Home Medications    Prior to Admission medications   Medication Sig Start Date End Date Taking? Authorizing Provider  albuterol (VENTOLIN HFA) 108 (90 Base) MCG/ACT inhaler Inhale 2 puffs into the lungs every 4 (four) hours as needed for wheezing or shortness of breath. 03/09/21  Yes Jesaiah Fabiano R, NP  azithromycin (ZITHROMAX) 250 MG tablet Take 1 tablet (250 mg total) by mouth daily. Take first 2 tablets together, then 1 every day until finished. 03/09/21  Yes Marycruz Boehner R, NP  guaiFENesin-dextromethorphan (ROBITUSSIN DM) 100-10 MG/5ML syrup Take 10 mLs by mouth every 4 (four) hours as needed for cough. 03/09/21  Yes Sharilynn Cassity, Leitha Schuller, NP  acetaminophen (TYLENOL) 500 MG tablet Take 500-1,000 mg by mouth every 6 (six) hours as needed (for pain.).    [provider]  diclofenac (VOLTAREN) 75 MG EC tablet Take 1 tablet (75 mg total) by mouth 2 (two) times daily. Patient taking differently: Take 75 mg by mouth 2 (two) times daily as needed (for pain.).  04/19/16   Katy Apo, NP  escitalopram (LEXAPRO) 10  MG tablet Take 10 mg by mouth daily.    [provider]  fluticasone (FLONASE) 50 MCG/ACT nasal spray Place 1 spray into both nostrils daily as needed (for allergies).     [provider]  glimepiride (AMARYL) 4 MG tablet Take 4 mg by mouth daily with breakfast.    [provider]  HYDROcodone-acetaminophen (NORCO/VICODIN) 5-325 MG tablet Take 1 tablet by mouth every 8 (eight) hours as needed. 01/16/19   Coral Spikes, DO  ibuprofen (ADVIL,MOTRIN) 200 MG tablet Take 400 mg by mouth  every 8 (eight) hours as needed (for pain.).     [provider]  metFORMIN (GLUCOPHAGE) 500 MG tablet Take 500 mg by mouth daily.    [provider]  omeprazole (PRILOSEC) 20 MG capsule Take 20 mg by mouth daily.    [provider]    Family History Family History  Problem Relation Age of Onset   Diabetes Mother    Dementia Mother    Lung cancer Father     Social History Social History   Tobacco Use   Smoking status: Never   Smokeless tobacco: Never  Vaping Use   Vaping Use: Never used  Substance Use Topics   Alcohol use: No   Drug use: No     Allergies   Patient has no known allergies.   Review of Systems Review of Systems  Constitutional: Negative.   HENT:  Positive for congestion and rhinorrhea. Negative for dental problem, drooling, ear discharge, ear pain, facial swelling, hearing loss, mouth sores, nosebleeds, postnasal drip, sinus pressure, sinus pain, sneezing, sore throat, tinnitus, trouble swallowing and voice change.   Respiratory:  Positive for cough and shortness of breath. Negative for apnea, choking, chest tightness, wheezing and stridor.   Skin: Negative.   Neurological: Negative.     Physical Exam Triage Vital Signs ED Triage Vitals  Enc Vitals Group     BP 03/09/21 1109 (!) 150/81     Pulse Rate 03/09/21 1109 90     Resp 03/09/21 1109 (!) 21     Temp 03/09/21 1109 98.4 F (36.9 C)     Temp Source 03/09/21 1109 Oral     SpO2 03/09/21 1109 100 %     Weight 03/09/21 1111 167 lb (75.8 kg)     Height 03/09/21 1111 5\' 7"  (1.702 m)     Head Circumference --      Peak Flow --      Pain Score 03/09/21 1111 2     Pain Loc --      Pain Edu? --      Excl. in [place]? --    No data found.  Updated Vital Signs BP (!) 150/81 (BP Location: Left Arm)   Pulse 90   Temp 98.4 F (36.9 C) (Oral)   Resp (!) 21   Ht 5\' 7"  (1.702 m)   Wt 167 lb (75.8 kg)   SpO2 100%   BMI 26.16 kg/m   Visual Acuity Right Eye Distance:   Left  Eye Distance:   Bilateral Distance:    Right Eye Near:   Left Eye Near:    Bilateral Near:     Physical Exam Constitutional:      Appearance: Normal appearance. She is normal weight.  HENT:     Head: Normocephalic.     Right Ear: Tympanic membrane, ear canal and external ear normal.     Left Ear: Tympanic membrane, ear canal and external ear normal.  Nose: Congestion and rhinorrhea present.     Mouth/Throat:     Mouth: Mucous membranes are moist.     Pharynx: Posterior oropharyngeal erythema present.  Eyes:     Extraocular Movements: Extraocular movements intact.  Cardiovascular:     Rate and Rhythm: Normal rate and regular rhythm.     Pulses: Normal pulses.     Heart sounds: Normal heart sounds.  Pulmonary:     Effort: Pulmonary effort is normal.     Breath sounds: Normal breath sounds.  Musculoskeletal:     Cervical back: Normal range of motion and neck supple.  Skin:    General: Skin is warm and dry.  Neurological:     General: No focal deficit present.     Mental Status: She is alert and oriented to person, place, and time. Mental status is at baseline.  Psychiatric:        Mood and Affect: Mood normal.        Behavior: Behavior normal.     UC Treatments / Results  Labs (all labs ordered are listed, but only abnormal results are displayed) Labs Reviewed - No data to display  EKG   Radiology No results found.  Procedures Procedures (including critical care time)  Medications Ordered in UC Medications - No data to display  Initial Impression / Assessment and Plan / UC Course  I have reviewed the triage vital signs and the nursing notes.  Pertinent labs & imaging results that were available during my care of the patient were reviewed by me and considered in my medical decision making (see chart for details).  Acute cough  nasal congestion  Azithromycin 500 mg once then 250 mg for 4 days Robitussin-DM 100-10 mg/5 mils 10 mL every 4 hours as  needed Albuterol inhaler 108 mcg 2 puffs every 4 hours as needed Strict precautions given for worsening breathing to go to nearest emergency department, for persistent symptoms follow-up with primary care doctor or urgent care For episode of slurred speech discussed possibility of stroke, advised patient to notify primary care doctor soon as possible for follow-up, patient endorses that she will be going out of town within the next few days and has upcoming appointment in December, strict precautions given today at any point if symptoms are to recur to go to the nearest emergency department for evaluation  of TIA and stroke Final Clinical Impressions(s) / UC Diagnoses   Final diagnoses:  Acute cough  Nasal congestion     Discharge Instructions      Take azithromycin 2 pills on the first day then 1 pill for the remaining time, this is to cover any infection  You may use the cough syrup every 4 hours as needed to help with the congestion and coughing  You may use the albuterol inhaler 2 puffs every 4 hours to help With shortness of breath or wheezing  At any point if your breathing worsens please go to the nearest emergency department for further evaluation  If your symptoms persist but do not worsen you may return to urgent care to be evaluated  Please follow-up with your primary doctor for your loss of speech for further evaluation     ED Prescriptions     Medication Sig Dispense Auth. Provider   azithromycin (ZITHROMAX) 250 MG tablet Take 1 tablet (250 mg total) by mouth daily. Take first 2 tablets together, then 1 every day until finished. 6 tablet Kathy Wares, Leitha Schuller, NP   guaiFENesin-dextromethorphan (ROBITUSSIN DM) 100-10  MG/5ML syrup Take 10 mLs by mouth every 4 (four) hours as needed for cough. 236 mL Mylo Choi R, NP   albuterol (VENTOLIN HFA) 108 (90 Base) MCG/ACT inhaler Inhale 2 puffs into the lungs every 4 (four) hours as needed for wheezing or shortness of breath.  18 g Hans Eden, NP      PDMP not reviewed this encounter.   Hans Eden, [place] 03/09/21 1207

## 2021-04-12 DIAGNOSIS — Z0000 Encounter for general adult medical examination without abnormal findings: Secondary | ICD-10-CM | POA: Diagnosis not present

## 2021-04-12 DIAGNOSIS — G4733 Obstructive sleep apnea (adult) (pediatric): Secondary | ICD-10-CM | POA: Diagnosis not present

## 2021-04-12 DIAGNOSIS — F32A Depression, unspecified: Secondary | ICD-10-CM | POA: Diagnosis not present

## 2021-04-12 DIAGNOSIS — J302 Other seasonal allergic rhinitis: Secondary | ICD-10-CM | POA: Diagnosis not present

## 2021-04-12 DIAGNOSIS — F3340 Major depressive disorder, recurrent, in remission, unspecified: Secondary | ICD-10-CM | POA: Diagnosis not present

## 2021-04-12 DIAGNOSIS — Z23 Encounter for immunization: Secondary | ICD-10-CM | POA: Diagnosis not present

## 2021-04-12 DIAGNOSIS — E1165 Type 2 diabetes mellitus with hyperglycemia: Secondary | ICD-10-CM | POA: Diagnosis not present

## 2021-04-12 DIAGNOSIS — K219 Gastro-esophageal reflux disease without esophagitis: Secondary | ICD-10-CM | POA: Diagnosis not present

## 2021-04-12 DIAGNOSIS — E119 Type 2 diabetes mellitus without complications: Secondary | ICD-10-CM | POA: Diagnosis not present

## 2021-04-13 DIAGNOSIS — R8290 Unspecified abnormal findings in urine: Secondary | ICD-10-CM | POA: Diagnosis not present

## 2021-05-28 ENCOUNTER — Other Ambulatory Visit: Payer: Self-pay | Admitting: Physician Assistant

## 2021-05-28 ENCOUNTER — Other Ambulatory Visit: Payer: Self-pay

## 2021-05-28 ENCOUNTER — Ambulatory Visit
Admission: RE | Admit: 2021-05-28 | Discharge: 2021-05-28 | Disposition: A | Discharge status: Home / Self Care | Payer: Medicare HMO | Source: Ambulatory Visit | Attending: Physician Assistant | Admitting: Physician Assistant

## 2021-05-28 DIAGNOSIS — Z8673 Personal history of transient ischemic attack (TIA), and cerebral infarction without residual deficits: Secondary | ICD-10-CM | POA: Diagnosis not present

## 2021-05-28 DIAGNOSIS — G459 Transient cerebral ischemic attack, unspecified: Secondary | ICD-10-CM | POA: Insufficient documentation

## 2021-05-28 DIAGNOSIS — R4701 Aphasia: Secondary | ICD-10-CM | POA: Insufficient documentation

## 2021-05-28 DIAGNOSIS — E1165 Type 2 diabetes mellitus with hyperglycemia: Secondary | ICD-10-CM | POA: Diagnosis not present

## 2021-05-28 MED ORDER — GADOBUTROL 1 MMOL/ML IV SOLN
7.5000 mL | Freq: Once | INTRAVENOUS | Status: AC | PRN
  Administered 2021-05-28: 7.5 mL via INTRAVENOUS

## 2021-06-14 DIAGNOSIS — R4701 Aphasia: Secondary | ICD-10-CM | POA: Diagnosis not present

## 2021-06-14 DIAGNOSIS — G629 Polyneuropathy, unspecified: Secondary | ICD-10-CM | POA: Diagnosis not present

## 2021-06-14 DIAGNOSIS — R251 Tremor, unspecified: Secondary | ICD-10-CM | POA: Diagnosis not present

## 2021-10-05 DIAGNOSIS — G4733 Obstructive sleep apnea (adult) (pediatric): Secondary | ICD-10-CM | POA: Diagnosis not present

## 2021-10-05 DIAGNOSIS — E1165 Type 2 diabetes mellitus with hyperglycemia: Secondary | ICD-10-CM | POA: Diagnosis not present

## 2021-10-05 DIAGNOSIS — F3340 Major depressive disorder, recurrent, in remission, unspecified: Secondary | ICD-10-CM | POA: Diagnosis not present

## 2021-10-05 DIAGNOSIS — K219 Gastro-esophageal reflux disease without esophagitis: Secondary | ICD-10-CM | POA: Diagnosis not present

## 2021-10-05 DIAGNOSIS — F419 Anxiety disorder, unspecified: Secondary | ICD-10-CM | POA: Diagnosis not present

## 2021-10-05 DIAGNOSIS — J302 Other seasonal allergic rhinitis: Secondary | ICD-10-CM | POA: Diagnosis not present

## 2021-10-06 DIAGNOSIS — R8290 Unspecified abnormal findings in urine: Secondary | ICD-10-CM | POA: Diagnosis not present

## 2021-10-11 DIAGNOSIS — K219 Gastro-esophageal reflux disease without esophagitis: Secondary | ICD-10-CM | POA: Diagnosis not present

## 2021-10-11 DIAGNOSIS — F32A Depression, unspecified: Secondary | ICD-10-CM | POA: Diagnosis not present

## 2021-10-11 DIAGNOSIS — J302 Other seasonal allergic rhinitis: Secondary | ICD-10-CM | POA: Diagnosis not present

## 2021-10-11 DIAGNOSIS — B9620 Unspecified Escherichia coli [E. coli] as the cause of diseases classified elsewhere: Secondary | ICD-10-CM | POA: Diagnosis not present

## 2021-10-11 DIAGNOSIS — N390 Urinary tract infection, site not specified: Secondary | ICD-10-CM | POA: Diagnosis not present

## 2021-10-11 DIAGNOSIS — Z1231 Encounter for screening mammogram for malignant neoplasm of breast: Secondary | ICD-10-CM | POA: Diagnosis not present

## 2021-10-11 DIAGNOSIS — E119 Type 2 diabetes mellitus without complications: Secondary | ICD-10-CM | POA: Diagnosis not present

## 2021-10-12 ENCOUNTER — Other Ambulatory Visit: Payer: Self-pay | Admitting: Internal Medicine

## 2021-10-12 DIAGNOSIS — Z1231 Encounter for screening mammogram for malignant neoplasm of breast: Secondary | ICD-10-CM

## 2021-11-05 ENCOUNTER — Ambulatory Visit
Admission: RE | Admit: 2021-11-05 | Discharge: 2021-11-05 | Disposition: A | Discharge status: Home / Self Care | Payer: Medicare HMO | Source: Ambulatory Visit | Attending: Internal Medicine | Admitting: Internal Medicine

## 2021-11-05 DIAGNOSIS — Z1231 Encounter for screening mammogram for malignant neoplasm of breast: Secondary | ICD-10-CM | POA: Diagnosis not present

## 2021-11-21 IMAGING — CR DG FOOT COMPLETE 3+V*L*
3 series · 3 of 3 positions shown · non-contrast
Comparison: None.

CLINICAL DATA: 79-year-old female with fall and trauma to the left
foot.

EXAM:
LEFT ANKLE COMPLETE - 3+ VIEW; LEFT FOOT - COMPLETE 3+ VIEW

[foot ap]
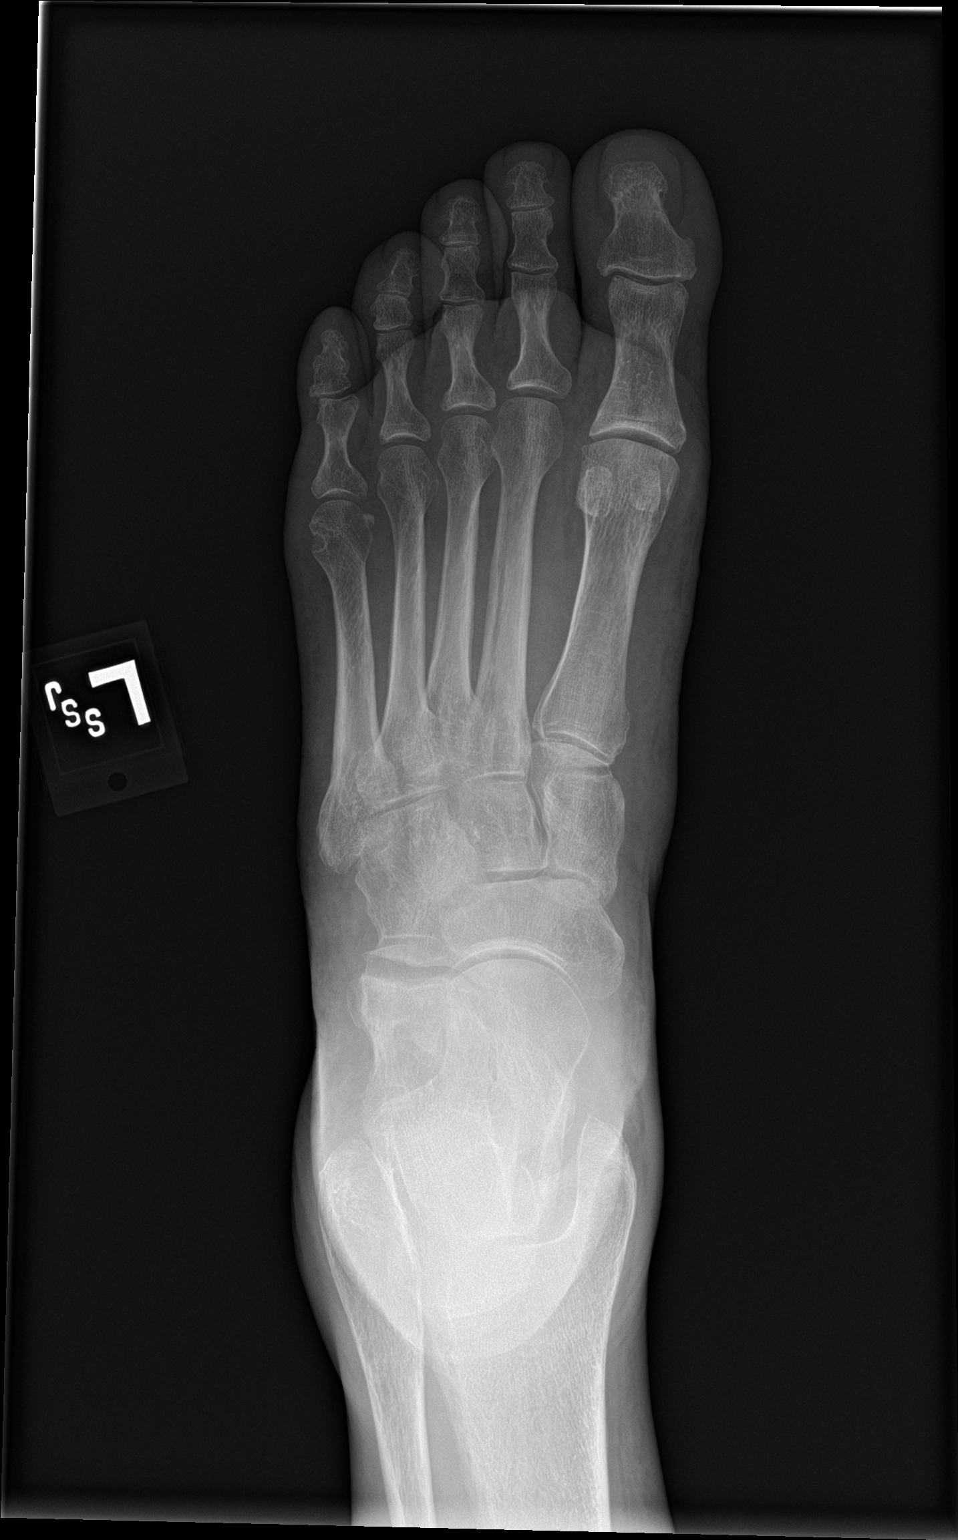

[foot obl]
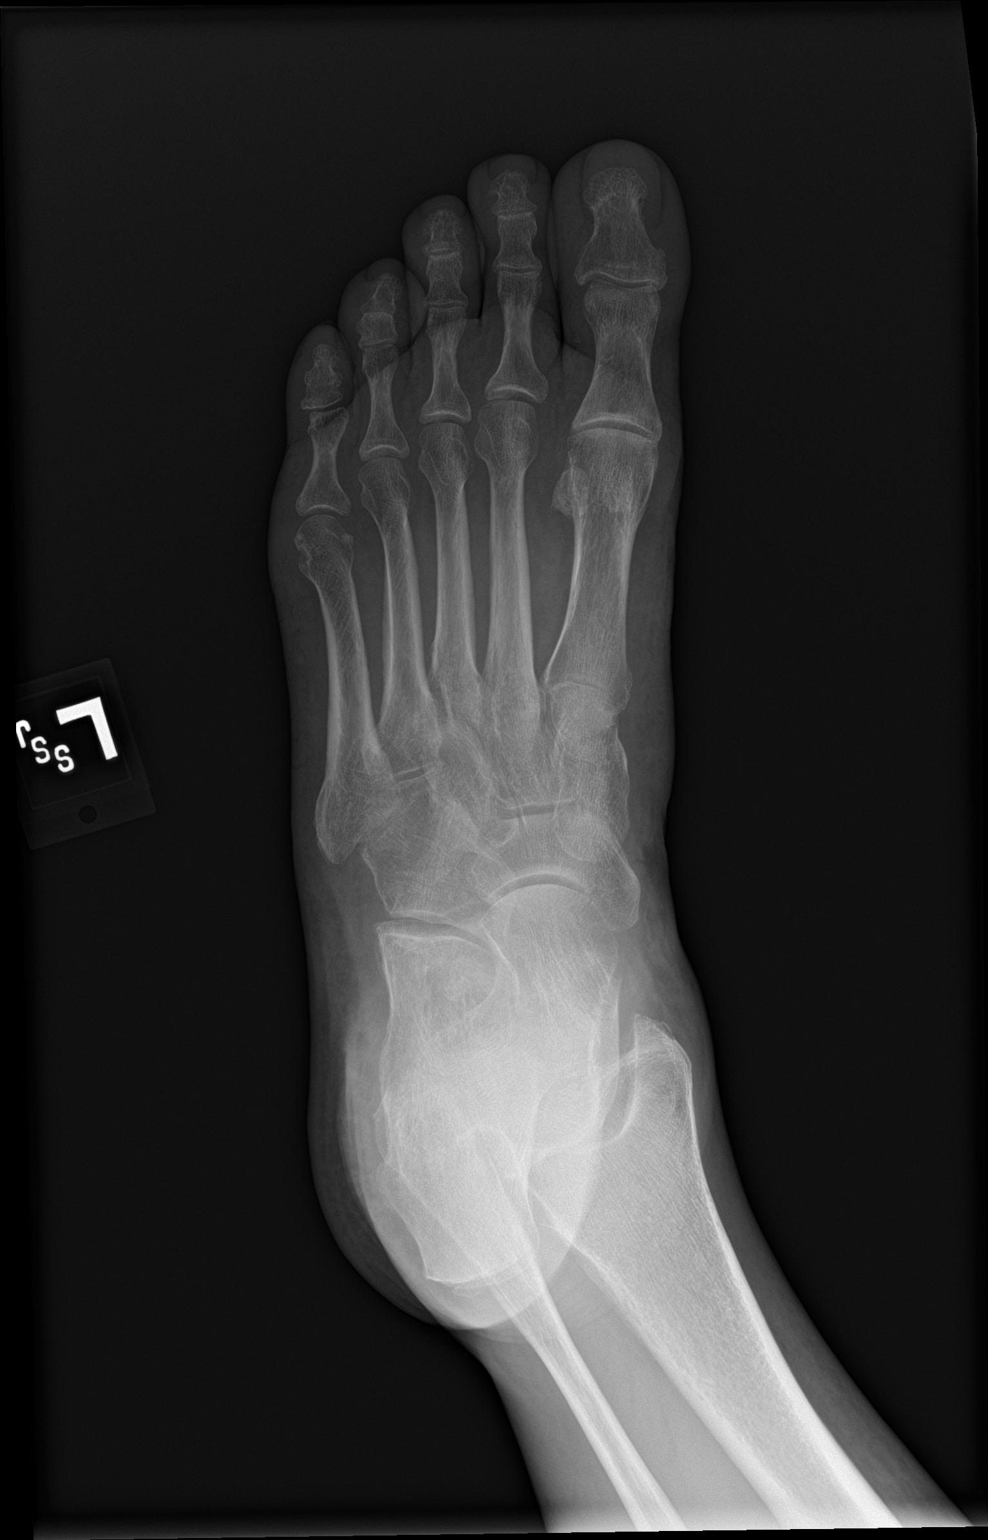

[foot lat]
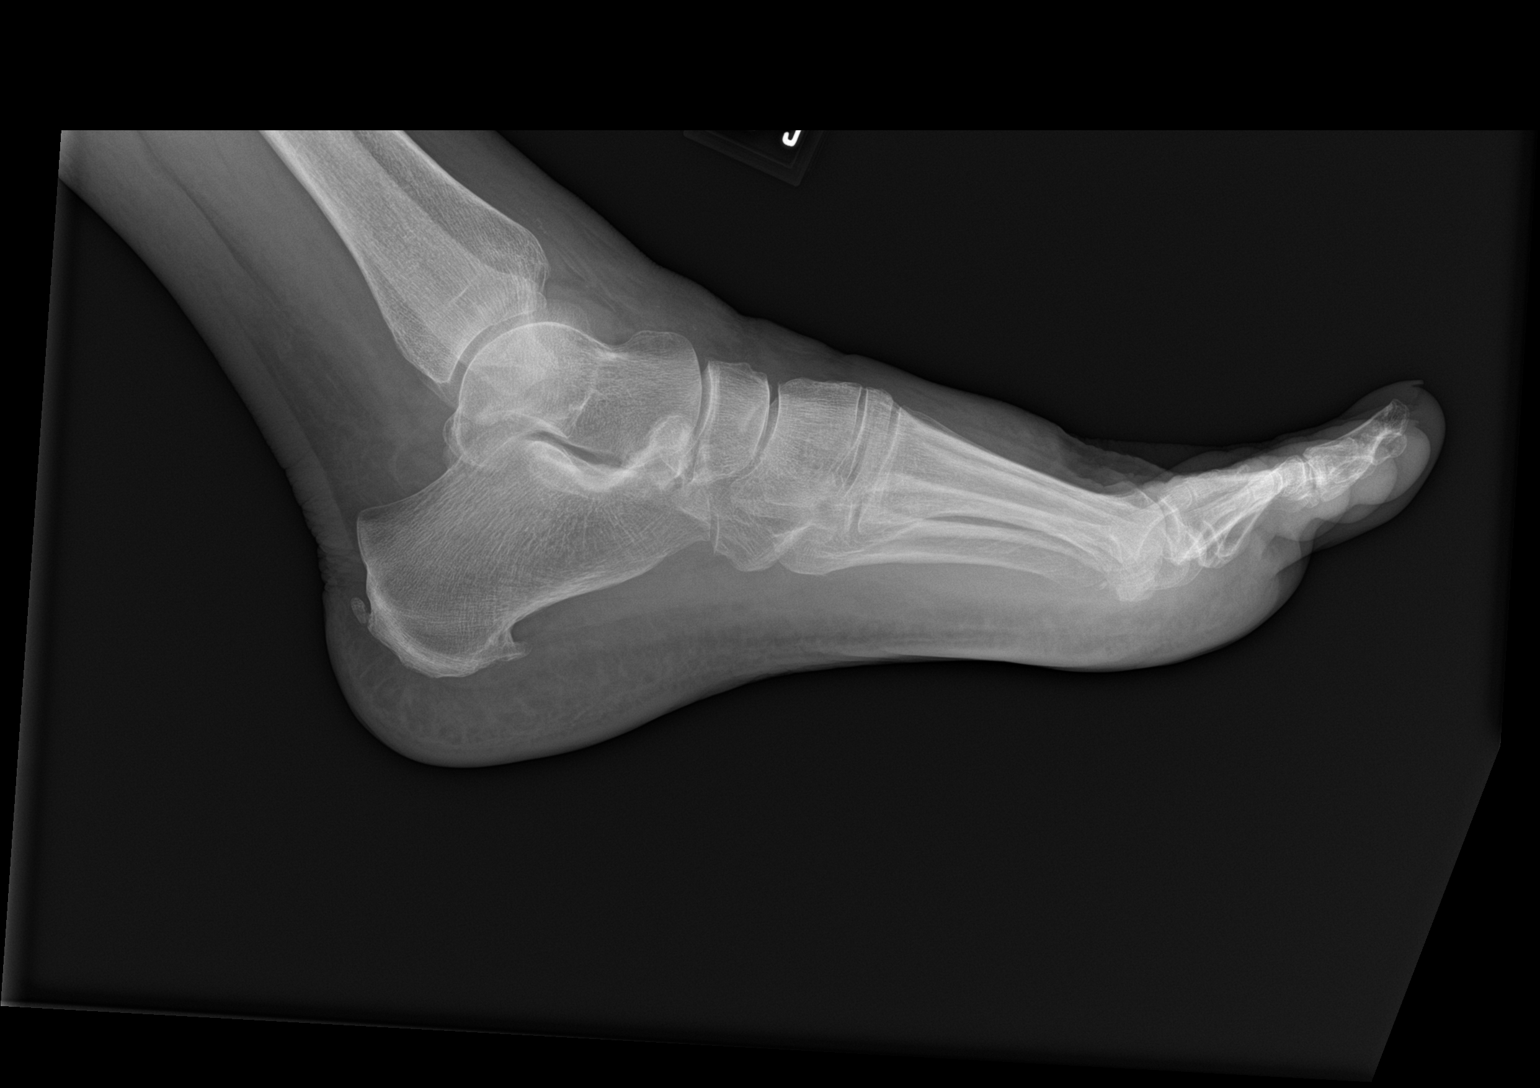

[3 of 3 positions shown; findings below may reference images not displayed]

FINDINGS: There is no acute fracture or dislocation. The bones are osteopenic.
The ankle mortise is intact. Mild soft tissue edema.
IMPRESSION: Negative.

## 2022-04-16 ENCOUNTER — Emergency Department: Payer: Medicare HMO

## 2022-04-16 ENCOUNTER — Inpatient Hospital Stay
Admission: EM | Admit: 2022-04-16 | Discharge: 2022-04-18 | DRG: 063 | Disposition: A | Discharge status: Home / Self Care | Payer: Medicare HMO | Attending: Internal Medicine | Admitting: Internal Medicine

## 2022-04-16 ENCOUNTER — Other Ambulatory Visit: Payer: Self-pay

## 2022-04-16 DIAGNOSIS — G8191 Hemiplegia, unspecified affecting right dominant side: Secondary | ICD-10-CM | POA: Diagnosis not present

## 2022-04-16 DIAGNOSIS — R29818 Other symptoms and signs involving the nervous system: Secondary | ICD-10-CM | POA: Diagnosis not present

## 2022-04-16 DIAGNOSIS — I639 Cerebral infarction, unspecified: Principal | ICD-10-CM | POA: Diagnosis present

## 2022-04-16 DIAGNOSIS — H9192 Unspecified hearing loss, left ear: Secondary | ICD-10-CM | POA: Diagnosis present

## 2022-04-16 DIAGNOSIS — Z833 Family history of diabetes mellitus: Secondary | ICD-10-CM | POA: Diagnosis not present

## 2022-04-16 DIAGNOSIS — R001 Bradycardia, unspecified: Secondary | ICD-10-CM | POA: Diagnosis not present

## 2022-04-16 DIAGNOSIS — K219 Gastro-esophageal reflux disease without esophagitis: Secondary | ICD-10-CM | POA: Diagnosis present

## 2022-04-16 DIAGNOSIS — I10 Essential (primary) hypertension: Secondary | ICD-10-CM | POA: Diagnosis present

## 2022-04-16 DIAGNOSIS — R531 Weakness: Secondary | ICD-10-CM | POA: Diagnosis not present

## 2022-04-16 DIAGNOSIS — I6389 Other cerebral infarction: Secondary | ICD-10-CM | POA: Diagnosis not present

## 2022-04-16 DIAGNOSIS — Z801 Family history of malignant neoplasm of trachea, bronchus and lung: Secondary | ICD-10-CM | POA: Diagnosis not present

## 2022-04-16 DIAGNOSIS — E1165 Type 2 diabetes mellitus with hyperglycemia: Secondary | ICD-10-CM | POA: Diagnosis present

## 2022-04-16 DIAGNOSIS — Z7984 Long term (current) use of oral hypoglycemic drugs: Secondary | ICD-10-CM

## 2022-04-16 DIAGNOSIS — R270 Ataxia, unspecified: Secondary | ICD-10-CM | POA: Diagnosis present

## 2022-04-16 DIAGNOSIS — E11649 Type 2 diabetes mellitus with hypoglycemia without coma: Secondary | ICD-10-CM | POA: Diagnosis present

## 2022-04-16 DIAGNOSIS — R29810 Facial weakness: Secondary | ICD-10-CM | POA: Diagnosis not present

## 2022-04-16 DIAGNOSIS — G4733 Obstructive sleep apnea (adult) (pediatric): Secondary | ICD-10-CM | POA: Diagnosis present

## 2022-04-16 DIAGNOSIS — E162 Hypoglycemia, unspecified: Secondary | ICD-10-CM | POA: Diagnosis not present

## 2022-04-16 DIAGNOSIS — R0689 Other abnormalities of breathing: Secondary | ICD-10-CM | POA: Diagnosis not present

## 2022-04-16 DIAGNOSIS — R29706 NIHSS score 6: Secondary | ICD-10-CM | POA: Diagnosis not present

## 2022-04-16 DIAGNOSIS — R4781 Slurred speech: Secondary | ICD-10-CM | POA: Diagnosis not present

## 2022-04-16 DIAGNOSIS — Z79899 Other long term (current) drug therapy: Secondary | ICD-10-CM

## 2022-04-16 DIAGNOSIS — E119 Type 2 diabetes mellitus without complications: Secondary | ICD-10-CM

## 2022-04-16 DIAGNOSIS — I6523 Occlusion and stenosis of bilateral carotid arteries: Secondary | ICD-10-CM | POA: Diagnosis not present

## 2022-04-16 DIAGNOSIS — R471 Dysarthria and anarthria: Secondary | ICD-10-CM | POA: Diagnosis present

## 2022-04-16 DIAGNOSIS — Z818 Family history of other mental and behavioral disorders: Secondary | ICD-10-CM | POA: Diagnosis not present

## 2022-04-16 DIAGNOSIS — R9431 Abnormal electrocardiogram [ECG] [EKG]: Secondary | ICD-10-CM | POA: Diagnosis not present

## 2022-04-16 LAB — CBC
HCT: 34.8 % — ABNORMAL LOW (ref 36.0–46.0)
Hemoglobin: 11.3 g/dL — ABNORMAL LOW (ref 12.0–15.0)
MCH: 29.6 pg (ref 26.0–34.0)
MCHC: 32.5 g/dL (ref 30.0–36.0)
MCV: 91.1 fL (ref 80.0–100.0)
Platelets: 225 10*3/uL (ref 150–400)
RBC: 3.82 MIL/uL — ABNORMAL LOW (ref 3.87–5.11)
RDW: 12.6 % (ref 11.5–15.5)
WBC: 7.2 10*3/uL (ref 4.0–10.5)
nRBC: 0 % (ref 0.0–0.2)

## 2022-04-16 LAB — COMPREHENSIVE METABOLIC PANEL
ALT: 11 U/L (ref 0–44)
AST: 18 U/L (ref 15–41)
Albumin: 3.6 g/dL (ref 3.5–5.0)
Alkaline Phosphatase: 47 U/L (ref 38–126)
Anion gap: 5 (ref 5–15)
BUN: 23 mg/dL (ref 8–23)
CO2: 21 mmol/L — ABNORMAL LOW (ref 22–32)
Calcium: 8.8 mg/dL — ABNORMAL LOW (ref 8.9–10.3)
Chloride: 110 mmol/L (ref 98–111)
Creatinine, Ser: 0.74 mg/dL (ref 0.44–1.00)
GFR, Estimated: >60 mL/min (ref >60)
Glucose, Bld: 213 mg/dL — ABNORMAL HIGH (ref 70–99)
Potassium: 4.2 mmol/L (ref 3.5–5.1)
Sodium: 136 mmol/L (ref 135–145)
Total Bilirubin: 0.5 mg/dL (ref 0.3–1.2)
Total Protein: 6.6 g/dL (ref 6.5–8.1)

## 2022-04-16 LAB — DIFFERENTIAL
Abs Immature Granulocytes: 0.02 10*3/uL (ref 0.00–0.07)
Basophils Absolute: 0 10*3/uL (ref 0.0–0.1)
Basophils Relative: 0 %
Eosinophils Absolute: 0.1 10*3/uL (ref 0.0–0.5)
Eosinophils Relative: 2 %
Immature Granulocytes: 0 %
Lymphocytes Relative: 24 %
Lymphs Abs: 1.7 10*3/uL (ref 0.7–4.0)
Monocytes Absolute: 0.4 10*3/uL (ref 0.1–1.0)
Monocytes Relative: 6 %
Neutro Abs: 4.9 10*3/uL (ref 1.7–7.7)
Neutrophils Relative %: 68 %

## 2022-04-16 LAB — GLUCOSE, CAPILLARY
Glucose-Capillary: 205 mg/dL — ABNORMAL HIGH (ref 70–99)
Glucose-Capillary: 168 mg/dL — ABNORMAL HIGH (ref 70–99)
Glucose-Capillary: 193 mg/dL — ABNORMAL HIGH (ref 70–99)
Glucose-Capillary: 183 mg/dL — ABNORMAL HIGH (ref 70–99)

## 2022-04-16 LAB — MRSA NEXT GEN BY PCR, NASAL: MRSA by PCR Next Gen: NOT DETECTED

## 2022-04-16 LAB — PROTIME-INR
INR: 1 (ref 0.8–1.2)
Prothrombin Time: 13.4 seconds (ref 11.4–15.2)

## 2022-04-16 LAB — ETHANOL: Alcohol, Ethyl (B): <10 mg/dL (ref <10)

## 2022-04-16 LAB — APTT: aPTT: 27 seconds (ref 24–36)

## 2022-04-16 MED ORDER — ONDANSETRON HCL 4 MG/2ML IJ SOLN
INTRAMUSCULAR | Status: AC
  Administered 2022-04-16: 4 mg
  Filled 2022-04-16: qty 2

## 2022-04-16 MED ORDER — ORAL CARE MOUTH RINSE: 15.0000 mL | OROMUCOSAL | Status: DC | PRN

## 2022-04-16 MED ORDER — LABETALOL HCL 5 MG/ML IV SOLN
INTRAVENOUS | Status: AC
  Administered 2022-04-16: 10 mg
  Filled 2022-04-16: qty 4

## 2022-04-16 MED ORDER — CHLORHEXIDINE GLUCONATE CLOTH 2 % EX PADS
6.0000 | MEDICATED_PAD | Freq: Every day | CUTANEOUS | Status: DC
  Administered 2022-04-17: 6 via TOPICAL

## 2022-04-16 MED ORDER — PANTOPRAZOLE SODIUM 40 MG IV SOLR
40.0000 mg | Freq: Every day | INTRAVENOUS | Status: DC
  Administered 2022-04-16: 40 mg via INTRAVENOUS
  Filled 2022-04-16: qty 10

## 2022-04-16 MED ORDER — TENECTEPLASE FOR STROKE
PACK | INTRAVENOUS | Status: AC
  Administered 2022-04-16: 20 mg via INTRAVENOUS
  Filled 2022-04-16: qty 10

## 2022-04-16 MED ORDER — CLEVIDIPINE BUTYRATE 0.5 MG/ML IV EMUL: 0.0000 mg/h | INTRAVENOUS | Status: DC

## 2022-04-16 MED ORDER — ACETAMINOPHEN 650 MG RE SUPP: 650.0000 mg | RECTAL | Status: DC | PRN

## 2022-04-16 MED ORDER — SODIUM CHLORIDE 0.9% FLUSH
3.0000 mL | Freq: Once | INTRAVENOUS | Status: AC
  Administered 2022-04-16: 3 mL via INTRAVENOUS

## 2022-04-16 MED ORDER — ACETAMINOPHEN 160 MG/5ML PO SOLN: 650.0000 mg | ORAL | Status: DC | PRN

## 2022-04-16 MED ORDER — ACETAMINOPHEN 325 MG PO TABS
650.0000 mg | ORAL_TABLET | ORAL | Status: DC | PRN
  Administered 2022-04-17: 650 mg via ORAL
  Filled 2022-04-16: qty 2

## 2022-04-16 MED ORDER — HYDRALAZINE HCL 20 MG/ML IJ SOLN: 10.0000 mg | INTRAMUSCULAR | Status: DC | PRN

## 2022-04-16 MED ORDER — INSULIN ASPART 100 UNIT/ML IJ SOLN
3.0000 [IU] | INTRAMUSCULAR | Status: DC
  Administered 2022-04-16 (×2): 6 [IU] via SUBCUTANEOUS
  Administered 2022-04-17: 3 [IU] via SUBCUTANEOUS
  Administered 2022-04-17: 6 [IU] via SUBCUTANEOUS
  Administered 2022-04-17: 9 [IU] via SUBCUTANEOUS
  Filled 2022-04-16 (×4): qty 1

## 2022-04-16 MED ORDER — IOHEXOL 350 MG/ML SOLN
75.0000 mL | Freq: Once | INTRAVENOUS | Status: AC | PRN
  Administered 2022-04-16: 75 mL via INTRAVENOUS

## 2022-04-16 MED ORDER — STROKE: EARLY STAGES OF RECOVERY BOOK: Freq: Once | Status: AC

## 2022-04-16 MED ORDER — TENECTEPLASE FOR STROKE: 0.2500 mg/kg | PACK | Freq: Once | INTRAVENOUS | Status: AC

## 2022-04-16 NOTE — Progress Notes (Signed)
[place] responded to code stroke. [facility] offered compassionate presence and prayer.

## 2022-04-16 NOTE — Progress Notes (Signed)
CODE STROKE- PHARMACY COMMUNICATION  Time CODE STROKE called/page received: 1223  Time response to CODE STROKE was made (in person or via phone): 1245  Time Stroke Kit retrieved from [place] (only if needed): 1250  Name of Provider/Nurse contacted: Roland Rack  Past Medical History:  Diagnosis Date   Allergic rhinitis    Anxiety    Arthritis    Deaf, left    Diabetes mellitus without complication ([place])    GERD (gastroesophageal reflux disease)    History of kidney stones    Sleep apnea    OSA---HAS C-PAP BUT   DOES NOT USE   Prior to Admission medications   Medication Sig Start Date End Date Taking? Authorizing Provider  acetaminophen (TYLENOL) 500 MG tablet Take 500-1,000 mg by mouth every 6 (six) hours as needed (for pain.).    [provider]  albuterol (VENTOLIN HFA) 108 (90 Base) MCG/ACT inhaler Inhale 2 puffs into the lungs every 4 (four) hours as needed for wheezing or shortness of breath. 03/09/21   White, Leitha Schuller, NP  azithromycin (ZITHROMAX) 250 MG tablet Take 1 tablet (250 mg total) by mouth daily. Take first 2 tablets together, then 1 every day until finished. 03/09/21   Hans Eden, NP  diclofenac (VOLTAREN) 75 MG EC tablet Take 1 tablet (75 mg total) by mouth 2 (two) times daily. Patient taking differently: Take 75 mg by mouth 2 (two) times daily as needed (for pain.).  04/19/16   Katy Apo, NP  escitalopram (LEXAPRO) 10 MG tablet Take 10 mg by mouth daily.    [provider]  fluticasone (FLONASE) 50 MCG/ACT nasal spray Place 1 spray into both nostrils daily as needed (for allergies).     [provider]  glimepiride (AMARYL) 4 MG tablet Take 4 mg by mouth daily with breakfast.    [provider]  guaiFENesin-dextromethorphan (ROBITUSSIN DM) 100-10 MG/5ML syrup Take 10 mLs by mouth every 4 (four) hours as needed for cough. 03/09/21   White, Leitha Schuller, NP  HYDROcodone-acetaminophen (NORCO/VICODIN) 5-325 MG tablet  Take 1 tablet by mouth every 8 (eight) hours as needed. 01/16/19   Coral Spikes, DO  ibuprofen (ADVIL,MOTRIN) 200 MG tablet Take 400 mg by mouth every 8 (eight) hours as needed (for pain.).     [provider]  metFORMIN (GLUCOPHAGE) 500 MG tablet Take 500 mg by mouth daily.    [provider]  omeprazole (PRILOSEC) 20 MG capsule Take 20 mg by mouth daily.    [provider]   Will M. Ouida Sills, PharmD PGY-1 Pharmacy Resident 04/16/2022 1:24 PM

## 2022-04-16 NOTE — H&P (Signed)
NAME:  Gabriella [name], MRN:  [id number], DOB:  10-18-1940, LOS: 0 ADMISSION DATE:  04/16/2022  CHIEF COMPLAINT:  acute CVA  History of Present Illness:   81 y.o. female with a history of of diabetes who presents with right-sided weakness that started abruptly at 1145 this morning.     She was in her normal state of health prior to this.  She states that she started feeling dizzy, became nauseated.    On EMS arrival, they noticed that she had right-sided weakness and dysarthria and activated a code stroke en route.   CT head NEG S/p TNKASE GIVEN  PCCM ASKED FOR ADMISSION    Significant Hospital Events: Including procedures, antibiotic start and stop dates in addition to other pertinent events   12/19 CODE STROKE S/P TNKASE  given 1PM, admitted to ICU TPA admission order set      Objective   Blood pressure (!) 160/73, pulse 60, temperature 98 F (36.7 C), resp. rate 16, height '5\' 7"'$  (1.702 m), weight 80.5 kg, SpO2 100 %.       No intake or output data in the 24 hours ending 04/16/22 1456 Filed Weights   04/16/22 1250  Weight: 80.5 kg       Review of Systems: Gen:  Denies  fever, sweats, chills weight loss  HEENT: Denies blurred vision, double vision, ear pain, eye pain, hearing loss, nose bleeds, sore throat Cardiac:  No dizziness, chest pain or heaviness, chest tightness,edema, No JVD Resp:   No cough, -sputum production, -shortness of breath,-wheezing, -hemoptysis,  Other:  All other systems negative    Physical Examination:   General Appearance: No distress  EYES PERRLA, EOM intact.   NECK Supple, No JVD Pulmonary: normal breath sounds, No wheezing.  CardiovascularNormal S1,S2.  No m/r/g.   Abdomen: Benign, Soft, non-tender. Residual RT sided weakness Speech back to normal ALL OTHER ROS ARE NEGATIVE    Labs/imaging that I havepersonally reviewed  (right click and "Reselect all SmartList Selections" daily)      ASSESSMENT AND  PLAN SYNOPSIS  81 yo admitted for acute CVA s/p TNKASE  ACUTE CVA s/p TNKASE - HgbA1c, fasting lipid panel - MRI of the brain without contrast - Frequent neuro checks - Echocardiogram - Prophylactic therapy-none for 24 hours - Risk factor modification - Telemetry monitoring - PT consult, OT consult, Speech consult -avoid secondary brain injury Follow up Neuro recs  -follow labs as needed -replace as needed -pharmacy consultation and following  ELECTROLYTES -follow labs as needed -replace as needed -pharmacy consultation and following     Best practice (right click and "Reselect all SmartList Selections" daily)  Diet: as tolerated GI prophylaxis: PPI Mobility:  bed rest  Code Status:  FULL Disposition:ICU  Labs   CBC: Recent Labs  Lab 04/16/22 1234  WBC 7.2  NEUTROABS 4.9  HGB 11.3*  HCT 34.8*  MCV 91.1  PLT 979    Basic Metabolic Panel: Recent Labs  Lab 04/16/22 1234  NA 136  K 4.2  CL 110  CO2 21*  GLUCOSE 213*  BUN 23  CREATININE 0.74  CALCIUM 8.8*   GFR: Estimated Creatinine Clearance: 60.2 mL/min (by C-G formula based on SCr of 0.74 mg/dL). Recent Labs  Lab 04/16/22 1234  WBC 7.2    Liver Function Tests: Recent Labs  Lab 04/16/22 1234  AST 18  ALT 11  ALKPHOS 47  BILITOT 0.5  PROT 6.6  ALBUMIN 3.6   No results for input(s): "LIPASE", "AMYLASE" in  the last 168 hours. No results for input(s): "AMMONIA" in the last 168 hours.  ABG No results found for: "PHART", "PCO2ART", "PO2ART", "HCO3", "TCO2", "ACIDBASEDEF", "O2SAT"   Coagulation Profile: Recent Labs  Lab 04/16/22 1234  INR 1.0    Cardiac Enzymes: No results for input(s): "CKTOTAL", "CKMB", "CKMBINDEX", "TROPONINI" in the last 168 hours.  HbA1C: No results found for: "HGBA1C"  CBG: No results for input(s): "GLUCAP" in the last 168 hours.   Past Medical History:  She,  has a past medical history of Allergic rhinitis, Anxiety, Arthritis, Deaf, left, Diabetes  mellitus without complication (Gabriella [name]), GERD (gastroesophageal reflux disease), History of kidney stones, and Sleep apnea.   Surgical History:   Past Surgical History:  Procedure Laterality Date   BREAST BIOPSY Right 2008   stereo-benign   BREAST BIOPSY Left 05/17/2016   usual ductal hyperplasia   BREAST CYST ASPIRATION Left    BREAST EXCISIONAL BIOPSY Left 12/13/2016   neg   BREAST LUMPECTOMY Left 12/13/2016   BREAST LUMPECTOMY WITH NEEDLE LOCALIZATION Left 12/13/2016   Procedure: BREAST LUMPECTOMY WITH NEEDLE LOCALIZATION;  Surgeon: Gabriella Green, MD;  Location: [facility];  Service: General;  Laterality: Left;   COLONOSCOPY WITH PROPOFOL N/A 05/31/2017   Procedure: COLONOSCOPY WITH PROPOFOL;  Surgeon: [name], Gabriella Pike, MD;  Location: [facility];  Service: Gastroenterology;  Laterality: N/A;   EYE SURGERY Bilateral    Cataract Extraction with IOL   TONSILLECTOMY     TUBAL LIGATION       Social History:   reports that she has never smoked. She has never used smokeless tobacco. She reports that she does not drink alcohol and does not use drugs.   Family History:  Her family history includes Dementia in her mother; Diabetes in her mother; Lung cancer in her father.   Allergies No Known Allergies   Home Medications  Prior to Admission medications   Medication Sig Start Date End Date Taking? Authorizing Provider  escitalopram (LEXAPRO) 20 MG tablet Take 20 mg by mouth daily.   Yes [provider]  glimepiride (AMARYL) 4 MG tablet Take 4 mg by mouth daily with breakfast.   Yes [provider]  metFORMIN (GLUCOPHAGE) 500 MG tablet Take 500 mg by mouth daily.   Yes [provider]  omeprazole (PRILOSEC) 20 MG capsule Take 20 mg by mouth daily.   Yes [provider]  acetaminophen (TYLENOL) 500 MG tablet Take 500-1,000 mg by mouth every 6 (six) hours as needed (for pain.).    [provider]  albuterol (VENTOLIN HFA) 108 (90 Base)  MCG/ACT inhaler Inhale 2 puffs into the lungs every 4 (four) hours as needed for wheezing or shortness of breath. Patient not taking: Reported on 04/16/2022 03/09/21   Gabriella Eden, NP  azithromycin (ZITHROMAX) 250 MG tablet Take 1 tablet (250 mg total) by mouth daily. Take first 2 tablets together, then 1 every day until finished. Patient not taking: Reported on 04/16/2022 03/09/21   Gabriella Eden, NP  diclofenac (VOLTAREN) 75 MG EC tablet Take 1 tablet (75 mg total) by mouth 2 (two) times daily. Patient not taking: Reported on 04/16/2022 04/19/16   Katy Apo, NP  escitalopram (LEXAPRO) 10 MG tablet Take 10 mg by mouth daily. Patient not taking: Reported on 04/16/2022    [provider]  fluticasone (FLONASE) 50 MCG/ACT nasal spray Place 1 spray into both nostrils daily as needed (for allergies).     [provider]  guaiFENesin-dextromethorphan [facility] DM)  100-10 MG/5ML syrup Take 10 mLs by mouth every 4 (four) hours as needed for cough. Patient not taking: Reported on 04/16/2022 03/09/21   Gabriella Eden, NP  HYDROcodone-acetaminophen (NORCO/VICODIN) 5-325 MG tablet Take 1 tablet by mouth every 8 (eight) hours as needed. Patient not taking: Reported on 04/16/2022 01/16/19   Coral Spikes, DO  ibuprofen (ADVIL,MOTRIN) 200 MG tablet Take 400 mg by mouth every 8 (eight) hours as needed (for pain.).     [provider]      Critical Care Time devoted to patient care services described in this note is 65 minutes.    Corrin Parker, M.D.  [facility]  Medical Director [place] Director [facility] Cardio-Pulmonary Department

## 2022-04-16 NOTE — Consult Note (Addendum)
Neurology Consultation Reason for Consult: Right-sided weakness Referring Physician: Corky Downs, R  CC: Right-sided weakness  History is obtained from: Patient  HPI: Gabriella [name] is a 81 y.o. female with a history of of diabetes who presents with right-sided weakness that started abruptly at 1145 this morning.  She was in her normal state of health prior to this.  She states that she started feeling dizzy, became nauseated.  On EMS arrival, they noticed that she had right-sided weakness and dysarthria and activated a code stroke en route.   LKW: 11:45 AM tnk given?:  Yes NIH stroke scale: 6   Past Medical History:  Diagnosis Date   Allergic rhinitis    Anxiety    Arthritis    Deaf, left    Diabetes mellitus without complication ([facility])    GERD (gastroesophageal reflux disease)    History of kidney stones    Sleep apnea    OSA---HAS C-PAP BUT   DOES NOT USE     Family History  Problem Relation Age of Onset   Diabetes Mother    Dementia Mother    Lung cancer Father      Social History:  reports that she has never smoked. She has never used smokeless tobacco. She reports that she does not drink alcohol and does not use drugs.   Exam: Current vital signs: There were no vitals taken for this visit. Vital signs in last 24 hours:     Physical Exam  Constitutional: Appears well-developed and well-nourished.   Neuro: Mental Status: Patient is awake, alert, oriented to person, place, month, year, and situation. Patient is able to give a clear and coherent history. No signs of aphasia or neglect Cranial Nerves: II: Visual Fields are full. Pupils are equal, round, and reactive to light.   III,IV, VI: EOMI without ptosis or diploplia.  V: Facial sensation is symmetric to temperature VII: Facial movement is symmetric.  VIII: hearing is intact to voice X: Uvula elevates symmetrically XI: Shoulder shrug is symmetric. XII: tongue is midline without atrophy or  fasciculations.  Motor: She has mild right arm and significant right leg drift, 4+/5 in the right arm 3/5 in the right leg Sensory: Sensation is symmetric to light touch and temperature in the arms and legs.  No extinction Cerebellar: She is ataxic out of proportion to weakness in both the right arm and leg    I have reviewed labs in epic and the results pertinent to this consultation are: Cmp - unremarkable  I have reviewed the images obtained: CT/CTA - negative  Impression: 81 year old female with a history of diabetes who presents with right-sided ataxia, weakness, dysarthria most consistent with a small ischemic stroke.  Given the degree of weakness, I suspect this will be a disabling deficit and therefore I discussed the risks, benefits, and alternatives of IV tenecteplase with the patient who expressed a desire to proceed.  She will need admission with close monitoring, secondary stroke prevention, PT/OT.  Recommendations: - HgbA1c, fasting lipid panel - MRI of the brain without contrast - Frequent neuro checks - Echocardiogram - Prophylactic therapy-none for 24 hours - Risk factor modification - Telemetry monitoring - PT consult, OT consult, Speech consult   This patient is critically ill and at significant risk of neurological worsening, death and care requires constant monitoring of vital signs, hemodynamics,respiratory and cardiac monitoring, neurological assessment, discussion with family, other specialists and medical decision making of high complexity. I spent 63 minutes of neurocritical care time  in  the care of  this patient. This was time spent independent of any time provided by nurse practitioner or PA.  Roland Rack, MD [facility] [phone]  If 7pm- 7am, please page neurology on call as listed in [place]. 04/16/2022  1:29 PM

## 2022-04-16 NOTE — Progress Notes (Signed)
Pt A&OX4. VSS and BP within neuro BP goal of systolic <364 and DBP <680. Scoring 0 on NIHSS. Passed yale swallow screen and is tolerating diet.

## 2022-04-16 NOTE — ED Provider Notes (Addendum)
[facility] Provider Note    Event Date/Time   First MD Initiated Contact with Patient 04/16/22 1243     (approximate)   History   Weakness   HPI  Gabriella [name] is a 81 y.o. female with a history of diabetes who presents with complaints of right arm weakness, right facial droop and slurred speech per EMS.  They report symptoms seem to have improved.  Onset was at 11:45 AM.  Patient feels her speech is still slurred     Physical Exam   Triage Vital Signs: ED Triage Vitals  Enc Vitals Group     BP      Pulse      Resp      Temp      Temp src      SpO2      Weight      Height      Head Circumference      Peak Flow      Pain Score      Pain Loc      Pain Edu?      Excl. in [place]?     Most recent vital signs: There were no vitals filed for this visit.   General: Awake, no distress.  CV:  Good peripheral perfusion.  Resp:  Normal effort.  Abd:  No distention.  Other:  Patient with weakness to the right arm, difficulty with touching her nose, no facial droop, cranial nerves appear normal   ED Results / Procedures / Treatments   Labs (all labs ordered are listed, but only abnormal results are displayed) Labs Reviewed  CBC - Abnormal; Notable for the following components:      Result Value   RBC 3.82 (*)    Hemoglobin 11.3 (*)    HCT 34.8 (*)    All other components within normal limits  COMPREHENSIVE METABOLIC PANEL - Abnormal; Notable for the following components:   CO2 21 (*)    Glucose, Bld 213 (*)    Calcium 8.8 (*)    All other components within normal limits  PROTIME-INR  APTT  DIFFERENTIAL  ETHANOL  CBG MONITORING, ED     EKG  ED ECG REPORT I, Lavonia Drafts, the attending physician, personally viewed and interpreted this ECG.  Date: 04/16/2022  Rhythm: normal sinus rhythm QRS Axis: normal Intervals: normal ST/T Wave abnormalities: normal Narrative Interpretation: no evidence of acute  ischemia     RADIOLOGY CT head viewed interpreted by me, no ICH    PROCEDURES:  Critical Care performed: yes  CRITICAL CARE Performed by: Lavonia Drafts   Total critical care time: 30 minutes  Critical care time was exclusive of separately billable procedures and treating other patients.  Critical care was necessary to treat or prevent imminent or life-threatening deterioration.  Critical care was time spent personally by me on the following activities: development of treatment plan with patient and/or surrogate as well as nursing, discussions with consultants, evaluation of patient's response to treatment, examination of patient, obtaining history from patient or surrogate, ordering and performing treatments and interventions, ordering and review of laboratory studies, ordering and review of radiographic studies, pulse oximetry and re-evaluation of patient's condition.   Procedures   MEDICATIONS ORDERED IN ED: Medications  sodium chloride flush (NS) 0.9 % injection 3 mL (has no administration in time range)  labetalol (NORMODYNE) 5 MG/ML injection (has no administration in time range)  ondansetron (ZOFRAN) 4 MG/2ML injection (has no administration in time  range)  tenecteplase (TNKASE) injection for Stroke 0.25 mg/kg (20 mg Intravenous Given 04/16/22 1303)  iohexol (OMNIPAQUE) 350 MG/ML injection 75 mL (75 mLs Intravenous Contrast Given 04/16/22 1319)     IMPRESSION / MDM / ASSESSMENT AND PLAN / ED COURSE  I reviewed the triage vital signs and the nursing notes. Patient's presentation is most consistent with acute presentation with potential threat to life or bodily function.  Patient presents with reports of facial droop, right arm weakness, slurred speech as above.  Symptoms have improved however still has right arm weakness on neuroexam.  Patient seen by Dr. Leonel Ramsay of neurology and accompanied to CT  Differential includes TIA, CVA, ICH  No evidence of ICH on CT  scan.  Neurology has opted to give TNK  Lab work reviewed thus far reassuring, still pending CMP  Patient is having vomiting, IV Zofran ordered.  Discussed with Dr. Mortimer Fries of the ICU team, he will admit        FINAL CLINICAL IMPRESSION(S) / ED DIAGNOSES   Final diagnoses:  Cerebrovascular accident (CVA), unspecified mechanism ([place])     Rx / DC Orders   ED Discharge Orders     None        Note:  This document was prepared using Dragon voice recognition software and may include unintentional dictation errors.   Lavonia Drafts, MD 04/16/22 1324    Lavonia Drafts, MD 04/16/22 1326

## 2022-04-16 NOTE — ED Triage Notes (Signed)
Pt arrived to ED via  [facility] for code stroke. Pt experiencing right-sided weakness, facial droop, and slurred speech. LKW at 1145. Neuro MD present on pt arrival. Labs, fingerstick, and NIH completed and pt to CT at 1245.

## 2022-04-17 ENCOUNTER — Inpatient Hospital Stay: Payer: Medicare HMO

## 2022-04-17 ENCOUNTER — Inpatient Hospital Stay (HOSPITAL_COMMUNITY)
Admit: 2022-04-17 | Discharge: 2022-04-17 | Disposition: A | Discharge status: Home / Self Care | Payer: Medicare HMO | Attending: Internal Medicine | Admitting: Internal Medicine

## 2022-04-17 DIAGNOSIS — R9431 Abnormal electrocardiogram [ECG] [EKG]: Secondary | ICD-10-CM

## 2022-04-17 DIAGNOSIS — I639 Cerebral infarction, unspecified: Secondary | ICD-10-CM | POA: Diagnosis not present

## 2022-04-17 LAB — GLUCOSE, CAPILLARY
Glucose-Capillary: 57 mg/dL — ABNORMAL LOW (ref 70–99)
Glucose-Capillary: 131 mg/dL — ABNORMAL HIGH (ref 70–99)
Glucose-Capillary: 134 mg/dL — ABNORMAL HIGH (ref 70–99)
Glucose-Capillary: 237 mg/dL — ABNORMAL HIGH (ref 70–99)
Glucose-Capillary: 243 mg/dL — ABNORMAL HIGH (ref 70–99)
Glucose-Capillary: 97 mg/dL (ref 70–99)

## 2022-04-17 LAB — LIPID PANEL
Cholesterol: 184 mg/dL (ref 0–200)
HDL: 43 mg/dL (ref >40)
LDL Cholesterol: 121 mg/dL — ABNORMAL HIGH (ref 0–99)
Total CHOL/HDL Ratio: 4.3 RATIO
Triglycerides: 101 mg/dL (ref <150)
VLDL: 20 mg/dL (ref 0–40)

## 2022-04-17 LAB — ECHOCARDIOGRAM COMPLETE
AR max vel: 3.21 cm2
AV Area VTI: 3.25 cm2
AV Area mean vel: 3.02 cm2
AV Mean grad: 4 mmHg
AV Peak grad: 7.6 mmHg
Ao pk vel: 1.38 m/s
Area-P 1/2: 2.91 cm2
Calc EF: 67.1 %
Height: 67 in
S' Lateral: 2.6 cm
Single Plane A2C EF: 61.7 %
Single Plane A4C EF: 73.1 %
Weight: 2765.45 oz

## 2022-04-17 MED ORDER — CLOPIDOGREL BISULFATE 75 MG PO TABS
300.0000 mg | ORAL_TABLET | Freq: Once | ORAL | Status: AC
  Administered 2022-04-17: 300 mg via ORAL
  Filled 2022-04-17: qty 4

## 2022-04-17 MED ORDER — ATORVASTATIN CALCIUM 20 MG PO TABS
40.0000 mg | ORAL_TABLET | Freq: Every day | ORAL | Status: DC
  Administered 2022-04-18: 40 mg via ORAL
  Filled 2022-04-17 (×2): qty 2

## 2022-04-17 MED ORDER — PANTOPRAZOLE SODIUM 40 MG PO TBEC
40.0000 mg | DELAYED_RELEASE_TABLET | Freq: Every day | ORAL | Status: DC
  Administered 2022-04-17: 40 mg via ORAL
  Filled 2022-04-17: qty 1

## 2022-04-17 MED ORDER — INSULIN ASPART 100 UNIT/ML IJ SOLN
0.0000 [IU] | Freq: Three times a day (TID) | INTRAMUSCULAR | Status: DC
  Administered 2022-04-18: 5 [IU] via SUBCUTANEOUS
  Filled 2022-04-17: qty 1

## 2022-04-17 MED ORDER — ESCITALOPRAM OXALATE 10 MG PO TABS
20.0000 mg | ORAL_TABLET | Freq: Every day | ORAL | Status: DC
  Administered 2022-04-17 – 2022-04-18 (×2): 20 mg via ORAL
  Filled 2022-04-17: qty 1
  Filled 2022-04-17 (×2): qty 2

## 2022-04-17 MED ORDER — ALBUTEROL SULFATE (2.5 MG/3ML) 0.083% IN NEBU: 3.0000 mL | INHALATION_SOLUTION | RESPIRATORY_TRACT | Status: DC | PRN

## 2022-04-17 MED ORDER — ASPIRIN 81 MG PO TBEC
81.0000 mg | DELAYED_RELEASE_TABLET | Freq: Every day | ORAL | Status: DC
  Administered 2022-04-17 – 2022-04-18 (×2): 81 mg via ORAL
  Filled 2022-04-17 (×2): qty 1

## 2022-04-17 MED ORDER — CLOPIDOGREL BISULFATE 75 MG PO TABS
75.0000 mg | ORAL_TABLET | Freq: Every day | ORAL | Status: DC
  Administered 2022-04-18: 75 mg via ORAL
  Filled 2022-04-17: qty 1

## 2022-04-17 NOTE — Progress Notes (Addendum)
SLP Cancellation Note  Patient Details Name: Gabriella [name] MRN: [id number] DOB: 1940-09-26   Cancelled treatment:       Reason Eval/Treat Not Completed: SLP screened, no needs identified, will sign off   SLP consult received and appreciated. Chart review completed. Consulted with RN and spoke to pt. NIHSS = 0. Pt speech is fluent, appropriate, and without s/sx dysarthria or anomia. Pt A&Ox4. Pt denies changes to speech/language/cognition or swallowing. Pt and RN made aware of SLP POC.  Time spend: 1100-1110  Cherrie Gauze, M.S., [facility] [phone] [place])  Quintella Baton 04/17/2022, 11:21 AM

## 2022-04-17 NOTE — Progress Notes (Signed)
PT Cancellation Note  Patient Details Name: Gabriella [name] MRN: [id number] DOB: [birth year]   Cancelled Treatment:    Reason Eval/Treat Not Completed: Patient not medically ready PT orders received, chart reviewed. Pt not medically ready for PT intervention 2/2:  1) Pt received TNK on 04/16/22 at 1303. Per protocol, hold for 24 hrs follow administration of TNK & await f/u imaging. 2) Pt also currently on cleviprex & per therapy protocol, will hold while pt on medication. Will f/u as able & as pt is medically appropriate for PT intervention.  Lavone Nian, PT, DPT 04/17/22, 8:22 AM   Waunita Schooner 04/17/2022, 8:20 AM

## 2022-04-17 NOTE — Progress Notes (Signed)
Progress Note    Gabriella [name]  VWU:[id number] DOB: June 20, 1940  DOA: 04/16/2022 PCP: Tracie Harrier, MD      Brief Narrative:    Medical records reviewed and are as summarized below:  Gabriella [name] is a 81 y.o. 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  Symptoms started around 11:45 AM on 04/16/2022.  EMS activated code stroke en route to the emergency room.  She was admitted to the ICU for acute stroke with right-sided weakness.  She was tPA.        Assessment/Plan:   Principal Problem:   CVA (cerebral vascular accident) ([place])   Body mass index is 27.07 kg/m.   Acute stroke with right-sided weakness and slurred speech: Slurred speech and right-sided weakness have resolved.  S/p tPA on 04/16/2022 at 3:03 PM.  Continue Lipitor.  2D echo showed EF estimated at 55 to 60%, mild concentric LVH, grade 1 diastolic dysfunction.  Follow-up with neurologist for further recommendations. MRI brain is pending. PT and OT evaluation pending. Transfer to [place].  Monitor on telemetry.  Type II DM with hyperglycemia, hypoglycemia (57): Glucose dropped to 57 on 04/17/2022 at 4:11 AM.  She had taken glimepiride prior to admission.  She was NPO on admission.  She is tolerating heart healthy/carb modified diet.  Hypoglycemia has resolved.  Use NovoLog as needed for hyperglycemia Hemoglobin A1c is pending  Plan discussed with her 2 sons at the bedside   Diet Order             Diet heart healthy/carb modified Room service appropriate? Yes; Fluid consistency: Thin  Diet effective now                            Consultants: Neurologist, intensivist  Procedures: None    Medications:    atorvastatin  40 mg Oral Daily   Chlorhexidine Gluconate Cloth  6 each Topical Q0600    escitalopram  20 mg Oral Daily   insulin aspart  0-9 Units Subcutaneous TID WC   pantoprazole  40 mg Oral QHS   Continuous Infusions:   Anti-infectives (From admission, onward)    None              Family Communication/Anticipated D/C date and plan/Code Status   DVT prophylaxis: SCD's Start: 04/16/22 1328     Code Status: Full Code  Family Communication: Plan discussed with 2 sons at the bedside Disposition Plan: Plan to discharge home tomorrow   Status is: Inpatient Remains inpatient appropriate because: S/p tPA       Subjective:   Interval events noted.  She feels better today.  Right-sided weakness and slurred speech have improved.  She feels as though nothing has happened to her.  2 sons were at the bedside.  Objective:    Vitals:   04/17/22 1000 04/17/22 1100 04/17/22 1200 04/17/22 1300  BP: 132/72 (!) 147/71 (!) 145/67 (!) 143/66  Pulse: 82 82 77 84  Resp: (!) '22 18 15 '$ (!) 21  Temp:   99 F (37.2 C)   TempSrc:   Oral   SpO2: 94% 100% 95% 97%  Weight:      Height:       No data found.   Intake/Output Summary (Last 24 hours) at 04/17/2022 1442 Last data filed  at 04/17/2022 1200 Gross per 24 hour  Intake 200 ml  Output 1350 ml  Net -1150 ml   Filed Weights   04/16/22 1250 04/16/22 1500  Weight: 80.5 kg 78.4 kg    Exam:  GEN: NAD SKIN: No rash EYES: EOMI ENT: MMM CV: RRR PULM: CTA B ABD: soft, ND, NT, +BS CNS: AAO x 3, non focal EXT: No edema or tenderness        Data Reviewed:   I have personally reviewed following labs and imaging studies:  Labs: Labs show the following:   Basic Metabolic Panel: Recent Labs  Lab 04/16/22 1234  NA 136  K 4.2  CL 110  CO2 21*  GLUCOSE 213*  BUN 23  CREATININE 0.74  CALCIUM 8.8*   GFR Estimated Creatinine Clearance: 59.5 mL/min (by C-G formula based on SCr of 0.74 mg/dL). Liver Function Tests: Recent Labs  Lab 04/16/22 1234  AST 18  ALT 11  ALKPHOS 47  BILITOT 0.5   PROT 6.6  ALBUMIN 3.6   No results for input(s): "LIPASE", "AMYLASE" in the last 168 hours. No results for input(s): "AMMONIA" in the last 168 hours. Coagulation profile Recent Labs  Lab 04/16/22 1234  INR 1.0    CBC: Recent Labs  Lab 04/16/22 1234  WBC 7.2  NEUTROABS 4.9  HGB 11.3*  HCT 34.8*  MCV 91.1  PLT 225   Cardiac Enzymes: No results for input(s): "CKTOTAL", "CKMB", "CKMBINDEX", "TROPONINI" in the last 168 hours. BNP (last 3 results) No results for input(s): "PROBNP" in the last 8760 hours. CBG: Recent Labs  Lab 04/17/22 0411 04/17/22 0516 04/17/22 0739 04/17/22 1139 04/17/22 1142  GLUCAP 57* 131* 134* 243* 237*   D-Dimer: No results for input(s): "DDIMER" in the last 72 hours. Hgb A1c: No results for input(s): "HGBA1C" in the last 72 hours. Lipid Profile: Recent Labs    04/17/22 0411  CHOL 184  HDL 43  LDLCALC 121*  TRIG 101  CHOLHDL 4.3   Thyroid function studies: No results for input(s): "TSH", "T4TOTAL", "T3FREE", "THYROIDAB" in the last 72 hours.  Invalid input(s): "FREET3" Anemia work up: No results for input(s): "VITAMINB12", "FOLATE", "FERRITIN", "TIBC", "IRON", "RETICCTPCT" in the last 72 hours. Sepsis Labs: Recent Labs  Lab 04/16/22 1234  WBC 7.2    Microbiology Recent Results (from the past 240 hour(s))  MRSA Next Gen by PCR, Nasal     Status: None   Collection Time: 04/16/22  3:06 PM   Specimen: Nasal Mucosa; Nasal Swab  Result Value Ref Range Status   MRSA by PCR Next Gen NOT DETECTED NOT DETECTED Final    Comment: (NOTE) The GeneXpert MRSA Assay (FDA approved for NASAL specimens only), is one component of a comprehensive MRSA colonization surveillance program. It is not intended to diagnose MRSA infection nor to guide or monitor treatment for MRSA infections. Test performance is not FDA approved in patients less than 75 years old. Performed at [facility], [address], [place], [place] [address]      Procedures and diagnostic studies:  ECHOCARDIOGRAM COMPLETE  Result Date: 04/17/2022    ECHOCARDIOGRAM REPORT   Patient Name:   Gabriella [name] Date of Exam: 04/17/2022 Medical Rec #:  [id number]      Height:       67.0 in Accession #:    [id number]     Weight:       172.8 lb Date of Birth:  February 01, 1941     BSA:  1.901 m Patient Age:    78 years       BP:           147/71 mmHg Patient Gender: F              HR:           79 bpm. Exam Location:  [facility] Procedure: Cardiac Doppler and Color Doppler Indications:     Abnormal ECG  History:         Patient has no prior history of Echocardiogram examinations. No                  significant cardiac history.  Sonographer:     L. Thornton-Maynard Referring Phys:  [id number] Flora Lipps Diagnosing Phys: Ida Rogue MD IMPRESSIONS  1. Left ventricular ejection fraction, by estimation, is 55 to 60%. The left ventricle has normal function. The left ventricle has no regional wall motion abnormalities. There is mild concentric hypertrophy with moderate asymmetric left ventricular hypertrophy of the basal-septal segment. Left ventricular diastolic parameters are consistent with Grade I diastolic dysfunction (impaired relaxation).  2. Right ventricular systolic function is normal. The right ventricular size is normal. There is normal pulmonary artery systolic pressure.  3. The mitral valve is normal in structure. No evidence of mitral valve regurgitation. No evidence of mitral stenosis.  4. The aortic valve is normal in structure. Aortic valve regurgitation is mild. No aortic stenosis is present.  5. The inferior vena cava is normal in size with greater than 50% respiratory variability, suggesting right atrial pressure of 3 mmHg. FINDINGS  Left Ventricle: Left ventricular ejection fraction, by estimation, is 55 to 60%. The left ventricle has normal function. The left ventricle has no regional wall motion abnormalities. The left ventricular internal cavity size was  normal in size. There is  moderate asymmetric left ventricular hypertrophy of the basal-septal segment. Left ventricular diastolic parameters are consistent with Grade I diastolic dysfunction (impaired relaxation). Right Ventricle: The right ventricular size is normal. No increase in right ventricular wall thickness. Right ventricular systolic function is normal. There is normal pulmonary artery systolic pressure. The tricuspid regurgitant velocity is 2.12 m/s, and  with an assumed right atrial pressure of 3 mmHg, the estimated right ventricular systolic pressure is 88.4 mmHg. Left Atrium: Left atrial size was normal in size. Right Atrium: Right atrial size was normal in size. Pericardium: There is no evidence of pericardial effusion. Mitral Valve: The mitral valve is normal in structure. No evidence of mitral valve regurgitation. No evidence of mitral valve stenosis. Tricuspid Valve: The tricuspid valve is normal in structure. Tricuspid valve regurgitation is mild . No evidence of tricuspid stenosis. Aortic Valve: The aortic valve is normal in structure. Aortic valve regurgitation is mild. No aortic stenosis is present. Aortic valve mean gradient measures 4.0 mmHg. Aortic valve peak gradient measures 7.6 mmHg. Aortic valve area, by VTI measures 3.25 cm. Pulmonic Valve: The pulmonic valve was not well visualized. Pulmonic valve regurgitation is not visualized. No evidence of pulmonic stenosis. Aorta: The aortic root is normal in size and structure. Venous: The inferior vena cava is normal in size with greater than 50% respiratory variability, suggesting right atrial pressure of 3 mmHg. IAS/Shunts: No atrial level shunt detected by color flow Doppler.  LEFT VENTRICLE PLAX 2D LVIDd:         4.20 cm     Diastology LVIDs:         2.60 cm     LV e' medial:  5.87 cm/s LV PW:         1.20 cm     LV E/e' medial:  12.9 LV IVS:        1.30 cm     LV e' lateral:   6.53 cm/s LVOT diam:     2.10 cm     LV E/e' lateral: 11.6  LV SV:         86 LV SV Index:   45 LVOT Area:     3.46 cm  LV Volumes (MOD) LV vol d, MOD A2C: 54.9 ml LV vol d, MOD A4C: 56.1 ml LV vol s, MOD A2C: 21.0 ml LV vol s, MOD A4C: 15.1 ml LV SV MOD A2C:     33.9 ml LV SV MOD A4C:     56.1 ml LV SV MOD BP:      37.5 ml RIGHT VENTRICLE RV Basal diam:  3.60 cm RV S prime:     12.90 cm/s TAPSE (M-mode): 2.0 cm LEFT ATRIUM             Index        RIGHT ATRIUM           Index LA diam:        3.80 cm 2.00 cm/m   RA Area:     13.80 cm LA Vol (A2C):   43.1 ml 22.68 ml/m  RA Volume:   34.00 ml  17.89 ml/m LA Vol (A4C):   66.6 ml 35.04 ml/m LA Biplane Vol: 55.9 ml 29.41 ml/m  AORTIC VALVE                    PULMONIC VALVE AV Area (Vmax):    3.21 cm     PV Vmax:          0.76 m/s AV Area (Vmean):   3.02 cm     PV Peak grad:     2.3 mmHg AV Area (VTI):     3.25 cm     PR End Diast Vel: 6.66 msec AV Vmax:           138.00 cm/s AV Vmean:          94.900 cm/s AV VTI:            0.265 m AV Peak Grad:      7.6 mmHg AV Mean Grad:      4.0 mmHg LVOT Vmax:         128.00 cm/s LVOT Vmean:        82.800 cm/s LVOT VTI:          0.249 m LVOT/AV VTI ratio: 0.94  AORTA Ao Root diam: 3.30 cm Ao Asc diam:  3.30 cm MITRAL VALVE                TRICUSPID VALVE MV Area (PHT): 2.91 cm     TR Peak grad:   18.0 mmHg MV Decel Time: 261 msec     TR Vmax:        212.00 cm/s MV E velocity: 76.00 cm/s MV A velocity: 116.00 cm/s  SHUNTS MV E/A ratio:  0.66         Systemic VTI:  0.25 m                             Systemic Diam: 2.10 cm Ida Rogue MD Electronically signed by Ida Rogue MD Signature Date/Time: 04/17/2022/11:37:20 AM  Final    CT ANGIO HEAD NECK W WO CM (CODE STROKE)  Addendum Date: 04/16/2022   ADDENDUM REPORT: 04/16/2022 14:03 ADDENDUM: The axial acquisition was repeated with satisfactory vascular opacification. The only additional information not included in the prior report is that the proximal vertebral arteries are widely patent. Electronically Signed   By: Nelson Chimes M.D.   On: 04/16/2022 14:03   Result Date: 04/16/2022 CLINICAL DATA:  Neuro deficit, acute, stroke suspected. Right-sided ataxia. EXAM: CT ANGIOGRAPHY HEAD AND NECK TECHNIQUE: Multidetector CT imaging of the head and neck was performed using the standard protocol during bolus administration of intravenous contrast. Multiplanar CT image reconstructions and MIPs were obtained to evaluate the vascular anatomy. Carotid stenosis measurements (when applicable) are obtained utilizing NASCET criteria, using the distal internal carotid diameter as the denominator. RADIATION DOSE REDUCTION: This exam was performed according to the departmental dose-optimization program which includes automated exposure control, adjustment of the mA and/or kV according to patient size and/or use of iterative reconstruction technique. CONTRAST:  73m OMNIPAQUE IOHEXOL 350 MG/ML SOLN COMPARISON:  Head CT earlier same day FINDINGS: CTA NECK FINDINGS Aortic arch: Aortic atherosclerosis. No aneurysm. Branching pattern is normal without origin stenosis. Right carotid system: Common carotid artery is tortuous but widely patent to the bifurcation. Mild plaque at the bifurcation but no stenosis. Cervical ICA widely patent to the skull base. Left carotid system: Common carotid artery widely patent to the bifurcation. Mild plaque at the bifurcation but no stenosis. Cervical ICA widely patent to the skull base. Vertebral arteries: Vertebral artery origins not well seen because of low vascular opacity and breathing motion. Both vertebral arteries show flow beyond the origins throughout the cervical region to the foramen magnum. Skeleton: Mild spondylosis. Other neck: No mass or lymphadenopathy. Upper chest: Lung apices are clear. Review of the MIP images confirms the above findings CTA HEAD FINDINGS Anterior circulation: Contrast opacity is poor. Both internal carotid arteries are patent through the siphon regions. There is siphon atherosclerotic  calcification but no stenosis greater than 30% suspected. Anterior and middle cerebral arteries show flow in their proximal extents. Distal vessels show atherosclerotic irregularity but no visible large vessel occlusion Posterior circulation: Both vertebral arteries are patent through the foramen magnum. Mild plaque at both vertebral artery V4 segments but without stenosis greater than 30% suspected. Both vertebral arteries reach the basilar. No basilar stenosis. P posterior cerebral arteries show considerable atherosclerotic irregularity but no large branch occlusion. Venous sinuses: Poorly seen because of bolus timing. No thrombosis suspected. Anatomic variants: None significant. Review of the MIP images confirms the above findings IMPRESSION: 1. Poor contrast opacity. No large vessel occlusion. 2. Aortic atherosclerosis. 3. Mild atherosclerotic change at both carotid bifurcations but without stenosis. 4. Atherosclerotic change in both carotid siphon regions but without stenosis greater than 30%. 5. Atherosclerotic irregularity of the distal intracranial vessels but without visible large vessel occlusion. 6. Atherosclerotic irregularity of both vertebral artery V4 segments but without stenosis greater than 30%. Aortic Atherosclerosis (ICD10-I70.0). Electronically Signed: By: MNelson ChimesM.D. On: 04/16/2022 13:35   CT HEAD CODE STROKE WO CONTRAST  Result Date: 04/16/2022 CLINICAL DATA:  Code stroke.  Right-sided ataxia. EXAM: CT HEAD WITHOUT CONTRAST TECHNIQUE: Contiguous axial images were obtained from the base of the skull through the vertex without intravenous contrast. RADIATION DOSE REDUCTION: This exam was performed according to the departmental dose-optimization program which includes automated exposure control, adjustment of the mA and/or kV according to patient size and/or use of iterative  reconstruction technique. COMPARISON:  MRI Brain 05/28/21 FINDINGS: Brain: No evidence of acute infarction,  hemorrhage, hydrocephalus, extra-axial collection or mass lesion/mass effect. There is a focal hypodensity in the pons which is an area of FLAIR signal abnormality seen on prior MRI brain dated 05/28/2021. Vascular: No hyperdense vessel or unexpected calcification. Skull: Normal. Negative for fracture or focal lesion. Sinuses/Orbits: No acute finding.  Bilateral lens replacement. Other: None ASPECTS ([place] Stroke Program Early CT Score):10 IMPRESSION: 1. No hemorrhage or CT evidence of a new infarct. 2. Aspects is 10. Findings were paged to Dr. Leonel Ramsay on 04/16/22 at 1:03 PM via [facility] paging system. Electronically Signed   By: Marin Roberts M.D.   On: 04/16/2022 13:04               LOS: 1 day   Osei Anger  [facility]   Pager on [email or web address]. If 7PM-7AM, please contact night-coverage at [email or web address]     04/17/2022, 2:42 PM

## 2022-04-17 NOTE — Progress Notes (Signed)
Subjective: Symptoms have improved.   Exam: Vitals:   04/17/22 1100 04/17/22 1200  BP: (!) 147/71 (!) 145/67  Pulse: 82 77  Resp: 18 15  Temp:  99 F (37.2 C)  SpO2: 100% 95%   Gen: In bed, NAD Resp: non-labored breathing, no acute distress Abd: soft, nt  Neuro: MS: Awake, alert, interactive and appropriate CN: Pupils equal round and reactive, extraocular movements intact, visual fields full, face symmetric Motor: 5/5 throughout Sensory: Intact to light touch Cerebellar: No ataxia on finger-nose-finger or heel-knee-shin  Pertinent Labs: LDL 125 A1C pending  Impression: 81 year old female who presented with unilateral ataxia numbness which improved markedly after TNK administration.  This likely represents either a small ischemic stroke, or ischemic stroke aborted with thrombolytic therapy.  Recommendations: 1) start ASA + plavix if no evidence of bleeding on MRI.  2) start high intensity atorvastatin 40 mg daily 3) PT, OT, ST now that bedrest is over 4) can be transferred from the unit 5) neurology will follow.   Roland Rack, MD [facility] [phone]  If 7pm- 7am, please page neurology on call as listed in [place].

## 2022-04-17 NOTE — Progress Notes (Signed)
OT Cancellation Note  Patient Details Name: Gabriella [name] MRN: [id number] DOB: Jun 13, 1940   Cancelled Treatment:    Reason Eval/Treat Not Completed: Patient not medically ready. OT orders received, chart reviewed. Pt received TNK on 04/16/22 at 1303. Per protocol, hold for 24 hrs following administration of TNK and await f/u imaging. Pt also currently on cleviprex and per therapy protocol, will hold while pt on medication. Will re-attempt evaluation as able and as pt medically appropriate.   Doneta Public 04/17/2022, 8:24 AM

## 2022-04-18 DIAGNOSIS — E162 Hypoglycemia, unspecified: Secondary | ICD-10-CM | POA: Diagnosis not present

## 2022-04-18 DIAGNOSIS — I10 Essential (primary) hypertension: Secondary | ICD-10-CM | POA: Diagnosis present

## 2022-04-18 LAB — GLUCOSE, CAPILLARY
Glucose-Capillary: 162 mg/dL — ABNORMAL HIGH (ref 70–99)
Glucose-Capillary: 167 mg/dL — ABNORMAL HIGH (ref 70–99)
Glucose-Capillary: 278 mg/dL — ABNORMAL HIGH (ref 70–99)

## 2022-04-18 LAB — HEMOGLOBIN A1C
Hgb A1c MFr Bld: 7 % — ABNORMAL HIGH (ref 4.8–5.6)
Mean Plasma Glucose: 154 mg/dL

## 2022-04-18 MED ORDER — ASPIRIN 81 MG PO TBEC: 81.0000 mg | DELAYED_RELEASE_TABLET | Freq: Every day | ORAL | Status: AC

## 2022-04-18 MED ORDER — LISINOPRIL 5 MG PO TABS: 5.0000 mg | ORAL_TABLET | Freq: Every day | ORAL | 0 refills | Status: DC

## 2022-04-18 MED ORDER — ATORVASTATIN CALCIUM 40 MG PO TABS: 40.0000 mg | ORAL_TABLET | Freq: Every day | ORAL | 0 refills | Status: AC

## 2022-04-18 MED ORDER — CLOPIDOGREL BISULFATE 75 MG PO TABS: 75.0000 mg | ORAL_TABLET | Freq: Every day | ORAL | 0 refills | Status: DC

## 2022-04-18 NOTE — Evaluation (Signed)
Occupational Therapy Evaluation Patient Details Name: Gabriella [name] MRN: [id number] DOB: [birth year] Today's Date: 04/18/2022   History of Present Illness 81 y.o. female with a history of of diabetes who presents with right-sided weakness. She was in her normal state of health prior to this.  She states that she started feeling dizzy, became nauseated. MRI brain IMPRESSION:  1. 9 mm focus of diffusion signal abnormality involving the right  frontal centrum semi ovale, consistent with a small subacute small  vessel type infarct.  2. No other acute intracranial abnormality.  3. Underlying mild chronic microvascular ischemic disease for age.   Clinical Impression   Patient seen for OT evaluation. Prior to admission, pt lived with son, was independent for ADLs/IADLs, and still driving. Pt is currently functioning at Mod I overall for ADLs. She completed functional transfers with Mod I and functional mobility to the bathroom with supervision and no AD. Pt reports symptoms have resolved. ROM, strength, sensation, vision, and coordination testing all WNL. No neurological deficits noted. At this time, pt does not demonstrate any acute OT needs. Will complete orders.    Recommendations for follow up therapy are one component of a multi-disciplinary discharge planning process, led by the attending physician.  Recommendations may be updated based on patient status, additional functional criteria and insurance authorization.   Follow Up Recommendations  No OT follow up     Assistance Recommended at Discharge PRN  Patient can return home with the following Assist for transportation   Functional Status Assessment     Equipment Recommendations  None recommended by OT    Recommendations for Other Services       Precautions / Restrictions Precautions Precautions: Fall Restrictions Weight Bearing Restrictions: No      Mobility Bed Mobility               General bed mobility comments: NT,  received/left in recliner    Transfers Overall transfer level: Modified independent Equipment used: None               General transfer comment: STS from recliner      Balance Overall balance assessment: Mild deficits observed, not formally tested                                         ADL either performed or assessed with clinical judgement   ADL Overall ADL's : Modified independent                                       General ADL Comments: Mod I overall for ADLs. Pt able to thread BLEs through brief in sitting then hike over B hips in standing. She completed functional transfers with Mod I and functional mobility to the bathroom with supervision and no AD.     Vision Baseline Vision/History: 1 Wears glasses (reading only) Patient Visual Report: No change from baseline Vision Assessment?: No apparent visual deficits Additional Comments: Pt denied blurry vision or diplopia     Perception     Praxis      Pertinent Vitals/Pain Pain Assessment Pain Assessment: No/denies pain     Hand Dominance     Extremity/Trunk Assessment Upper Extremity Assessment Upper Extremity Assessment: Overall WFL for tasks assessed   Lower Extremity Assessment Lower Extremity Assessment: Overall WFL for  tasks assessed       Communication Communication Communication: No difficulties   Cognition Arousal/Alertness: Awake/alert Behavior During Therapy: WFL for tasks assessed/performed Overall Cognitive Status: Within Functional Limits for tasks assessed                                       General Comments       Exercises Other Exercises Other Exercises: OT provided education re: role of OT, OT POC, post acute recs, sitting up for all meals, EOB/OOB mobility with assistance, home/fall safety.    Shoulder Instructions      Home Living Family/patient expects to be discharged to:: Private residence Living Arrangements:  Children Available Help at Discharge: Family;Available PRN/intermittently Type of Home: House             Bathroom Shower/Tub: Walk-in Psychologist, prison and probation services: Standard     Home Equipment: Shower seat;Grab bars - toilet;Rolling Environmental consultant (2 wheels)          Prior Functioning/Environment Prior Level of Function : Independent/Modified Independent;Driving               ADLs Comments: Independent ADLs/IADLs, uses pill box for medication management. Denies history of falls past 6 months.        OT Problem List: Impaired balance (sitting and/or standing)      OT Treatment/Interventions:      OT Goals(Current goals can be found in the care plan section) Acute Rehab OT Goals Patient Stated Goal: go home OT Goal Formulation: All assessment and education complete, DC therapy  OT Frequency:      Co-evaluation              AM-PAC OT "6 Clicks" Daily Activity     Outcome Measure Help from another person eating meals?: None Help from another person taking care of personal grooming?: None Help from another person toileting, which includes using toliet, bedpan, or urinal?: None Help from another person bathing (including washing, rinsing, drying)?: None Help from another person to put on and taking off regular upper body clothing?: None Help from another person to put on and taking off regular lower body clothing?: None 6 Click Score: 24   End of Session Equipment Utilized During Treatment: Gait belt Nurse Communication: Mobility status;Other (comment) (took off purewick to encourage pt ambulating to bathroom for toileting)  Activity Tolerance: Patient tolerated treatment well Patient left: in chair;with call Yamada/phone within reach  OT Visit Diagnosis: Unsteadiness on feet (R26.81)                Time: 5462-7035 OT Time Calculation (min): 16 min Charges:  OT General Charges $OT Visit: 1 Visit OT Evaluation $OT Eval Low Complexity: 1 Low  [facility] MS,  OTR/L ascom [phone]  04/18/22, 10:08 AM

## 2022-04-18 NOTE — Progress Notes (Signed)
  Transition of Care [facility]) Screening Note   Patient Details  Name: AMIL MOSEMAN Date of Birth: 09/12/1940   Transition of Care [facility]) CM/SW Contact:    Quin Hoop, LCSW Phone Number: 04/18/2022, 10:05 AM    Transition of Care Department [facility]) has reviewed patient and no TOC needs have been identified at this time. We will continue to monitor patient advancement through interdisciplinary progression rounds. If new patient transition needs arise, please place a TOC consult.

## 2022-04-18 NOTE — Evaluation (Signed)
Physical Therapy Evaluation Patient Details Name: Gabriella [name] MRN: [id number] DOB: 12/24/1940 Today's Date: 04/18/2022  History of Present Illness  Patient is a 81 year old female who presented with unilateral ataxia numbness which improved markedly after TNK administration. Likely representing either a small ischemic stroke, or ischemic stroke aborted with thrombolytic therapy per neurology.  Clinical Impression  Patient agreeable to PT and reports she has not been OOB since arriving to the hospital. She is usually independent with mobility without assistive device.  Today, the patient is Mod I with bed mobility and transfers. Mild unsteadiness initially with narrow base of support with ambulation, with improving gait pattern with increased hallway ambulation without assistive device and occasional cues for safety. The patient is likely nearing her baseline level of functional mobility. PT will follow up while in the hospital to maximize independence with no PT needs anticipated at discharge at this time.      Recommendations for follow up therapy are one component of a multi-disciplinary discharge planning process, led by the attending physician.  Recommendations may be updated based on patient status, additional functional criteria and insurance authorization.  Follow Up Recommendations No PT follow up      Assistance Recommended at Discharge PRN  Patient can return home with the following  Assist for transportation    Equipment Recommendations None recommended by PT  Recommendations for Other Services       Functional Status Assessment Patient has had a recent decline in their functional status and demonstrates the ability to make significant improvements in function in a reasonable and predictable amount of time.     Precautions / Restrictions Precautions Precautions: Fall Restrictions Weight Bearing Restrictions: No      Mobility  Bed Mobility Overal bed mobility:  Modified Independent                  Transfers Overall transfer level: Modified independent                      Ambulation/Gait Ambulation/Gait assistance: Supervision Gait Distance (Feet): 120 Feet Assistive device: None Gait Pattern/deviations: Narrow base of support, Decreased stride length Gait velocity: decreased     General Gait Details: mild unsteadiness with narrow base of support for the first few steps, with improving step length and balance with increased ambulation distance. no dizziness reported with activity. patient declined the need for using a rolling walker  Stairs            Wheelchair Mobility    Modified Rankin (Stroke Patients Only)       Balance Overall balance assessment: Needs assistance Sitting-balance support: Feet supported Sitting balance-Leahy Scale: Good     Standing balance support: No upper extremity supported Standing balance-Leahy Scale: Fair Standing balance comment: no external support required to maintain standing balance                             Pertinent Vitals/Pain Pain Assessment Pain Assessment: No/denies pain    Home Living Family/patient expects to be discharged to:: Private residence Living Arrangements: Children Available Help at Discharge: Family;Available PRN/intermittently Type of Home: House Home Access: Stairs to enter   [employer] of Steps: 2-3   Home Layout: One level Home Equipment: Shower seat;Grab bars - toilet;Rolling Walker (2 wheels)      Prior Function Prior Level of Function : Independent/Modified Independent;Driving  ADLs Comments: Independent ADLs/IADLs, uses pill box for medication management. Denies history of falls past 6 months.     Hand Dominance   Dominant Hand: Left (L hand to write and painting, R hand for other things)    Extremity/Trunk Assessment   Upper Extremity Assessment Upper Extremity Assessment: Overall  WFL for tasks assessed    Lower Extremity Assessment Lower Extremity Assessment: Overall WFL for tasks assessed (5/5 BLE hip add/abd, knee extension, dorsiflexion, plantarflexion; sensation and coordination intact)       Communication   Communication: No difficulties  Cognition Arousal/Alertness: Awake/alert Behavior During Therapy: WFL for tasks assessed/performed Overall Cognitive Status: Within Functional Limits for tasks assessed                                          General Comments General comments (skin integrity, edema, etc.): encouraged routine short distance ambulation and OOB to chair for upright conditioning as patient has been in the bed for over a day now.    Exercises     Assessment/Plan    PT Assessment Patient needs continued PT services  PT Problem List Decreased balance;Decreased activity tolerance;Decreased mobility       PT Treatment Interventions DME instruction;Gait training;Stair training;Functional mobility training;Therapeutic activities;Therapeutic exercise;Balance training;Neuromuscular re-education;Cognitive remediation;Patient/family education    PT Goals (Current goals can be found in the Care Plan section)  Acute Rehab PT Goals Patient Stated Goal: to go home PT Goal Formulation: With patient Time For Goal Achievement: 05/02/22 Potential to Achieve Goals: Good    Frequency Min 2X/week     Co-evaluation               AM-PAC PT "6 Clicks" Mobility  Outcome Measure Help needed turning from your back to your side while in a flat bed without using bedrails?: None Help needed moving from lying on your back to sitting on the side of a flat bed without using bedrails?: None Help needed moving to and from a bed to a chair (including a wheelchair)?: None Help needed standing up from a chair using your arms (e.g., wheelchair or bedside chair)?: None Help needed to walk in hospital room?: A Little Help needed climbing  3-5 steps with a railing? : A Little 6 Click Score: 22    End of Session Equipment Utilized During Treatment: Gait belt Activity Tolerance: Patient tolerated treatment well Patient left: in chair;with call Colon/phone within reach Nurse Communication: Mobility status PT Visit Diagnosis: Unsteadiness on feet (R26.81)    Time: 0830-0900 PT Time Calculation (min) (ACUTE ONLY): 30 min   Charges:   PT Evaluation $PT Eval Low Complexity: 1 Low PT Treatments $Therapeutic Activity: 8-22 mins        Minna Merritts, PT, MPT   Percell Locus 04/18/2022, 10:54 AM

## 2022-04-18 NOTE — Plan of Care (Signed)
Problem: Education: Goal: Knowledge of disease or condition will improve 04/18/2022 0444 by Abigail Butts, RN Outcome: Progressing 04/18/2022 0443 by Abigail Butts, RN Outcome: Progressing Goal: Knowledge of secondary prevention will improve (MUST DOCUMENT ALL) 04/18/2022 0444 by Abigail Butts, RN Outcome: Progressing 04/18/2022 0443 by Abigail Butts, RN Outcome: Progressing Goal: Knowledge of patient specific risk factors will improve Gabriella [name] N/A or DELETE if not current risk factor) 04/18/2022 0444 by Abigail Butts, RN Outcome: Progressing 04/18/2022 0443 by Abigail Butts, RN Outcome: Progressing   Problem: Ischemic Stroke/TIA Tissue Perfusion: Goal: Complications of ischemic stroke/TIA will be minimized 04/18/2022 0444 by Abigail Butts, RN Outcome: Progressing 04/18/2022 0443 by Abigail Butts, RN Outcome: Progressing   Problem: Coping: Goal: Will verbalize positive feelings about self 04/18/2022 0444 by Abigail Butts, RN Outcome: Progressing 04/18/2022 0443 by Abigail Butts, RN Outcome: Progressing Goal: Will identify appropriate support needs 04/18/2022 0444 by Abigail Butts, RN Outcome: Progressing 04/18/2022 0443 by Abigail Butts, RN Outcome: Progressing   Problem: Health Behavior/Discharge Planning: Goal: Ability to manage health-related needs will improve 04/18/2022 0444 by Abigail Butts, RN Outcome: Progressing 04/18/2022 0443 by Abigail Butts, RN Outcome: Progressing Goal: Goals will be collaboratively established with patient/family 04/18/2022 0444 by Abigail Butts, RN Outcome: Progressing 04/18/2022 0443 by Abigail Butts, RN Outcome: Progressing   Problem: Self-Care: Goal: Ability to participate in self-care as condition permits will improve 04/18/2022 0444 by Abigail Butts, RN Outcome: Progressing 04/18/2022 0443 by Abigail Butts, RN Outcome: Progressing Goal: Verbalization of feelings and  concerns over difficulty with self-care will improve 04/18/2022 0444 by Abigail Butts, RN Outcome: Progressing 04/18/2022 0443 by Abigail Butts, RN Outcome: Progressing Goal: Ability to communicate needs accurately will improve 04/18/2022 0444 by Abigail Butts, RN Outcome: Progressing 04/18/2022 0443 by Abigail Butts, RN Outcome: Progressing   Problem: Nutrition: Goal: Risk of aspiration will decrease 04/18/2022 0444 by Abigail Butts, RN Outcome: Progressing 04/18/2022 0443 by Abigail Butts, RN Outcome: Progressing Goal: Dietary intake will improve 04/18/2022 0444 by Abigail Butts, RN Outcome: Progressing 04/18/2022 0443 by Abigail Butts, RN Outcome: Progressing   Problem: Education: Goal: Knowledge of General Education information will improve Description: Including pain rating scale, medication(s)/side effects and non-pharmacologic comfort measures 04/18/2022 0444 by Abigail Butts, RN Outcome: Progressing 04/18/2022 0443 by Abigail Butts, RN Outcome: Progressing   Problem: Health Behavior/Discharge Planning: Goal: Ability to manage health-related needs will improve 04/18/2022 0444 by Abigail Butts, RN Outcome: Progressing 04/18/2022 0443 by Abigail Butts, RN Outcome: Progressing   Problem: Clinical Measurements: Goal: Ability to maintain clinical measurements within normal limits will improve 04/18/2022 0444 by Abigail Butts, RN Outcome: Progressing 04/18/2022 0443 by Abigail Butts, RN Outcome: Progressing Goal: Will remain free from infection 04/18/2022 0444 by Abigail Butts, RN Outcome: Progressing 04/18/2022 0443 by Abigail Butts, RN Outcome: Progressing Goal: Diagnostic test results will improve 04/18/2022 0444 by Abigail Butts, RN Outcome: Progressing 04/18/2022 0443 by Abigail Butts, RN Outcome: Progressing Goal: Respiratory complications will improve 04/18/2022 0444 by Abigail Butts, RN Outcome:  Progressing 04/18/2022 0443 by Abigail Butts, RN Outcome: Progressing Goal: Cardiovascular complication will be avoided 04/18/2022 0444 by Abigail Butts, RN Outcome: Progressing 04/18/2022 0443 by Abigail Butts, RN Outcome: Progressing   Problem: Activity: Goal: Risk for activity intolerance will decrease 04/18/2022 0444 by Abigail Butts, RN Outcome: Progressing 04/18/2022 0443 by Abigail Butts, RN Outcome: Progressing   Problem: Nutrition: Goal: Adequate nutrition will be maintained 04/18/2022 0444 by Abigail Butts, RN Outcome: Progressing 04/18/2022 0443 by Abigail Butts, RN Outcome: Progressing   Problem: Coping: Goal: Level of  anxiety will decrease 04/18/2022 0444 by Abigail Butts, RN Outcome: Progressing 04/18/2022 0443 by Abigail Butts, RN Outcome: Progressing   Problem: Elimination: Goal: Will not experience complications related to bowel motility 04/18/2022 0444 by Abigail Butts, RN Outcome: Progressing 04/18/2022 0443 by Abigail Butts, RN Outcome: Progressing Goal: Will not experience complications related to urinary retention 04/18/2022 0444 by Abigail Butts, RN Outcome: Progressing 04/18/2022 0443 by Abigail Butts, RN Outcome: Progressing   Problem: Pain Managment: Goal: General experience of comfort will improve 04/18/2022 0444 by Abigail Butts, RN Outcome: Progressing 04/18/2022 0443 by Abigail Butts, RN Outcome: Progressing   Problem: Safety: Goal: Ability to remain free from injury will improve 04/18/2022 0444 by Abigail Butts, RN Outcome: Progressing 04/18/2022 0443 by Abigail Butts, RN Outcome: Progressing   Problem: Skin Integrity: Goal: Risk for impaired skin integrity will decrease 04/18/2022 0444 by Abigail Butts, RN Outcome: Progressing 04/18/2022 0443 by Abigail Butts, RN Outcome: Progressing   Problem: Education: Goal: Ability to describe self-care measures that may prevent  or decrease complications (Diabetes Survival Skills Education) will improve 04/18/2022 0444 by Abigail Butts, RN Outcome: Progressing 04/18/2022 0443 by Abigail Butts, RN Outcome: Progressing Goal: Individualized Educational Video(s) 04/18/2022 0444 by Abigail Butts, RN Outcome: Progressing 04/18/2022 0443 by Abigail Butts, RN Outcome: Progressing   Problem: Coping: Goal: Ability to adjust to condition or change in health will improve 04/18/2022 0444 by Abigail Butts, RN Outcome: Progressing 04/18/2022 0443 by Abigail Butts, RN Outcome: Progressing   Problem: Fluid Volume: Goal: Ability to maintain a balanced intake and output will improve 04/18/2022 0444 by Abigail Butts, RN Outcome: Progressing 04/18/2022 0443 by Abigail Butts, RN Outcome: Progressing   Problem: Health Behavior/Discharge Planning: Goal: Ability to identify and utilize available resources and services will improve 04/18/2022 0444 by Abigail Butts, RN Outcome: Progressing 04/18/2022 0443 by Abigail Butts, RN Outcome: Progressing Goal: Ability to manage health-related needs will improve 04/18/2022 0444 by Abigail Butts, RN Outcome: Progressing 04/18/2022 0443 by Abigail Butts, RN Outcome: Progressing   Problem: Metabolic: Goal: Ability to maintain appropriate glucose levels will improve 04/18/2022 0444 by Abigail Butts, RN Outcome: Progressing 04/18/2022 0443 by Abigail Butts, RN Outcome: Progressing   Problem: Nutritional: Goal: Maintenance of adequate nutrition will improve 04/18/2022 0444 by Abigail Butts, RN Outcome: Progressing 04/18/2022 0443 by Abigail Butts, RN Outcome: Progressing Goal: Progress toward achieving an optimal weight will improve 04/18/2022 0444 by Abigail Butts, RN Outcome: Progressing 04/18/2022 0443 by Abigail Butts, RN Outcome: Progressing   Problem: Skin Integrity: Goal: Risk for impaired skin integrity will  decrease 04/18/2022 0444 by Abigail Butts, RN Outcome: Progressing 04/18/2022 0443 by Abigail Butts, RN Outcome: Progressing   Problem: Tissue Perfusion: Goal: Adequacy of tissue perfusion will improve 04/18/2022 0444 by Abigail Butts, RN Outcome: Progressing 04/18/2022 0443 by Abigail Butts, RN Outcome: Progressing

## 2022-04-18 NOTE — Discharge Summary (Addendum)
Physician Discharge Summary   Patient: Gabriella [name] MRN: [id number] DOB: [birth year]  Admit date:     04/16/2022  Discharge date: 04/18/22  Discharge Physician: Jennye Boroughs   PCP: Tracie Harrier, MD   Recommendations at discharge:   Follow-up with PCP in 1 week Follow-up with neurologist within 1 month of discharge  Discharge Diagnoses: Principal Problem:   CVA (cerebral vascular accident) [facility]) Active Problems:   Type 2 diabetes mellitus without complication, without long-term current use of insulin ([place])   Hypoglycemia   Essential hypertension  Resolved Problems:   * No resolved hospital problems. [facility] Course:  Ms. Gabriella [name] is a 81 y.o. 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  Symptoms started around 11:45 AM on 04/16/2022.  EMS activated code stroke en route to the emergency room.   She was admitted to the ICU for acute stroke with right-sided weakness. She was treated with tPA and monitored in the ICU.  All her symptoms have resolved.  She was able to ambulate with PT and OT without any problems.  She is okay for discharge. Plan discussed with Dr. Quinn Axe, neurologist on-call.   Assessment and Plan:   Acute stroke with right-sided weakness and slurred speech: Slurred speech and right-sided weakness have resolved.  S/p tPA on 1 She was discharged on aspirin, Plavix and Lipitor.  2D echo showed EF estimated at 55 to 60%, mild concentric LVH, grade 1 diastolic dysfunction.     Type II DM with hyperglycemia, recent hypoglycemia: Hemoglobin A1c was 7.  Continue glimepiride and metformin at discharge   Hypertension: Recommended lisinopril because of diabetes and LVH on echo.  Risks and benefits of all new medications were discussed with the patient and Marya Amsler, son at the  bedside.  Marya Amsler said he takes lisinopril and is familiar with the drug.        Consultants: Neurologist Procedures performed: None Disposition: Home Diet recommendation:  Discharge Diet Orders (From admission, onward)     Start     Ordered   04/18/22 0000  Diet - low sodium heart healthy        04/18/22 1209   04/18/22 0000  Diet Carb Modified        04/18/22 1209           Cardiac and Carb modified diet DISCHARGE MEDICATION: Allergies as of 04/18/2022   No Known Allergies      Medication List     STOP taking these medications    albuterol 108 (90 Base) MCG/ACT inhaler Commonly known as: VENTOLIN HFA   azithromycin 250 MG tablet Commonly known as: ZITHROMAX   diclofenac 75 MG EC tablet Commonly known as: VOLTAREN   guaiFENesin-dextromethorphan 100-10 MG/5ML syrup Commonly known as: ROBITUSSIN DM   HYDROcodone-acetaminophen 5-325 MG tablet Commonly known as: NORCO/VICODIN   ibuprofen 200 MG tablet Commonly known as: ADVIL       TAKE these medications    acetaminophen 500 MG tablet Commonly known as: TYLENOL Take 500-1,000 mg by mouth every 6 (six) hours as needed (for pain.).   aspirin EC 81 MG tablet Take 1 tablet (81 mg total) by mouth daily. Swallow whole. Start taking on: April 19, 2022   atorvastatin 40 MG tablet Commonly known as: LIPITOR Take 1 tablet (40 mg total) by mouth daily. Start taking on: April 19, 2022  clopidogrel 75 MG tablet Commonly known as: PLAVIX Take 1 tablet (75 mg total) by mouth daily. Start taking on: April 19, 2022   escitalopram 20 MG tablet Commonly known as: LEXAPRO Take 20 mg by mouth daily. What changed: Another medication with the same name was removed. Continue taking this medication, and follow the directions you see here.   fluticasone 50 MCG/ACT nasal spray Commonly known as: FLONASE Place 1 spray into both nostrils daily as needed (for allergies).   glimepiride 4 MG tablet Commonly  known as: AMARYL Take 4 mg by mouth daily with breakfast.   lisinopril 5 MG tablet Commonly known as: ZESTRIL Take 1 tablet (5 mg total) by mouth daily.   metFORMIN 500 MG tablet Commonly known as: GLUCOPHAGE Take 500 mg by mouth daily.   omeprazole 20 MG capsule Commonly known as: PRILOSEC Take 20 mg by mouth daily.        Follow-up Information     [facility]. Schedule an appointment as soon as possible for a visit in 1 month(s).   Why: stroke   Patient to make own follow up appt Office closed at this time Contact information: [address]               Discharge Exam: Danley Danker Weights   04/16/22 1250 04/16/22 1500  Weight: 80.5 kg 78.4 kg   GEN: NAD SKIN: Warm and dry EYES: No pallor or icterus ENT: MMM CV: RRR PULM: CTA B ABD: soft, ND, NT, +BS CNS: AAO x 3, non focal EXT: No edema or tenderness   Condition at discharge: good  The results of significant diagnostics from this hospitalization (including imaging, microbiology, ancillary and laboratory) are listed below for reference.   Imaging Studies: MR BRAIN WO CONTRAST  Result Date: 04/17/2022 CLINICAL DATA:  Follow-up examination for stroke. EXAM: MRI HEAD WITHOUT CONTRAST TECHNIQUE: Multiplanar, multiecho pulse sequences of the brain and surrounding structures were obtained without intravenous contrast. COMPARISON:  Prior CTs from earlier the same day as well as 04/16/2022, as well as previous brain MRI from 05/28/2021. FINDINGS: Brain: Cerebral volume within normal limits. Scattered patchy T2/FLAIR hyperintensity involving the periventricular deep white matter both cerebral hemispheres as well as the pons, most consistent with chronic small vessel ischemic disease, mild for age. 9 mm focus of mild diffusion signal abnormality seen involving the right frontal centrum semi ovale (series 5002, image 38). Associated  T2/FLAIR signal without definite ADC signal loss, consistent with a small subacute small vessel type infarct. No other evidence for acute or recent infarction. Gray-white matter differentiation otherwise maintained. No other areas of chronic cortical infarction. No acute intracranial hemorrhage. 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 No mass lesion, midline shift or mass effect. No hydrocephalus or extra-axial fluid collection. Pituitary gland and suprasellar region within normal limits. Vascular: Major intracranial vascular flow voids are maintained. Skull and upper cervical spine: Craniocervical junction within normal limits. Bone marrow signal intensity normal. No scalp soft tissue abnormality. Sinuses/Orbits: Prior bilateral ocular lens replacement. Small volume layering fluid noted within the left sphenoid sinus. Paranasal sinuses are otherwise clear. No mastoid effusion. Other: None. IMPRESSION: 1. 9 mm focus of diffusion signal abnormality involving the right frontal centrum semi ovale, consistent with a small subacute small vessel type infarct. 2. No other acute intracranial abnormality. 3. Underlying mild chronic microvascular ischemic disease for age. Electronically Signed   By: Marland Kitchen  Jeannine Boga M.D.   On: 04/17/2022 22:10   CT HEAD WO CONTRAST (5MM)  Result Date: 04/17/2022 CLINICAL DATA:  Stroke follow-up. EXAM: CT HEAD WITHOUT CONTRAST TECHNIQUE: Contiguous axial images were obtained from the base of the skull through the vertex without intravenous contrast. RADIATION DOSE REDUCTION: This exam was performed according to the departmental dose-optimization program which includes automated exposure control, adjustment of the mA and/or kV according to patient size and/or use of iterative reconstruction technique. COMPARISON:  CT Brain 04/16/22, CTA head/neck 04/16/22 FINDINGS: Brain: No evidence of acute infarction,  hemorrhage, hydrocephalus, extra-axial collection or mass lesion/mass effect. Vascular: No hyperdense vessel or unexpected calcification. Skull: Normal. Negative for fracture or focal lesion. Sinuses/Orbits: No acute finding.  Bilateral lens replacement Other: None. IMPRESSION: No acute intracranial pathology. Electronically Signed   By: Marin Roberts M.D.   On: 04/17/2022 16:27   ECHOCARDIOGRAM COMPLETE  Result Date: 04/17/2022    ECHOCARDIOGRAM REPORT   Patient Name:   JOHANN GASCOIGNE Date of Exam: 04/17/2022 Medical Rec #:  [id number]      Height:       67.0 in Accession #:    [id number]     Weight:       172.8 lb Date of Birth:  06-Jan-1941     BSA:          1.901 m Patient Age:    20 years       BP:           147/71 mmHg Patient Gender: F              HR:           79 bpm. Exam Location:  [facility] Procedure: Cardiac Doppler and Color Doppler Indications:     Abnormal ECG  History:         Patient has no prior history of Echocardiogram examinations. No                  significant cardiac history.  Sonographer:     L. Thornton-Maynard Referring Phys:  [id number] Flora Lipps Diagnosing Phys: Ida Rogue MD IMPRESSIONS  1. Left ventricular ejection fraction, by estimation, is 55 to 60%. The left ventricle has normal function. The left ventricle has no regional wall motion abnormalities. There is mild concentric hypertrophy with moderate asymmetric left ventricular hypertrophy of the basal-septal segment. Left ventricular diastolic parameters are consistent with Grade I diastolic dysfunction (impaired relaxation).  2. Right ventricular systolic function is normal. The right ventricular size is normal. There is normal pulmonary artery systolic pressure.  3. The mitral valve is normal in structure. No evidence of mitral valve regurgitation. No evidence of mitral stenosis.  4. The aortic valve is normal in structure. Aortic valve regurgitation is mild. No aortic stenosis is present.  5. The inferior vena cava is normal  in size with greater than 50% respiratory variability, suggesting right atrial pressure of 3 mmHg. FINDINGS  Left Ventricle: Left ventricular ejection fraction, by estimation, is 55 to 60%. The left ventricle has normal function. The left ventricle has no regional wall motion abnormalities. The left ventricular internal cavity size was normal in size. There is  moderate asymmetric left ventricular hypertrophy of the basal-septal segment. Left ventricular diastolic parameters are consistent with Grade I diastolic dysfunction (impaired relaxation). Right Ventricle: The right ventricular size is normal. No increase in right ventricular wall thickness. Right ventricular systolic function is normal. There is normal pulmonary artery systolic pressure. The tricuspid regurgitant velocity is  2.12 m/s, and  with an assumed right atrial pressure of 3 mmHg, the estimated right ventricular systolic pressure is 40.9 mmHg. Left Atrium: Left atrial size was normal in size. Right Atrium: Right atrial size was normal in size. Pericardium: There is no evidence of pericardial effusion. Mitral Valve: The mitral valve is normal in structure. No evidence of mitral valve regurgitation. No evidence of mitral valve stenosis. Tricuspid Valve: The tricuspid valve is normal in structure. Tricuspid valve regurgitation is mild . No evidence of tricuspid stenosis. Aortic Valve: The aortic valve is normal in structure. Aortic valve regurgitation is mild. No aortic stenosis is present. Aortic valve mean gradient measures 4.0 mmHg. Aortic valve peak gradient measures 7.6 mmHg. Aortic valve area, by VTI measures 3.25 cm. Pulmonic Valve: The pulmonic valve was not well visualized. Pulmonic valve regurgitation is not visualized. No evidence of pulmonic stenosis. Aorta: The aortic root is normal in size and structure. Venous: The inferior vena cava is normal in size with greater than 50% respiratory variability, suggesting right atrial pressure of 3  mmHg. IAS/Shunts: No atrial level shunt detected by color flow Doppler.  LEFT VENTRICLE PLAX 2D LVIDd:         4.20 cm     Diastology LVIDs:         2.60 cm     LV e' medial:    5.87 cm/s LV PW:         1.20 cm     LV E/e' medial:  12.9 LV IVS:        1.30 cm     LV e' lateral:   6.53 cm/s LVOT diam:     2.10 cm     LV E/e' lateral: 11.6 LV SV:         86 LV SV Index:   45 LVOT Area:     3.46 cm  LV Volumes (MOD) LV vol d, MOD A2C: 54.9 ml LV vol d, MOD A4C: 56.1 ml LV vol s, MOD A2C: 21.0 ml LV vol s, MOD A4C: 15.1 ml LV SV MOD A2C:     33.9 ml LV SV MOD A4C:     56.1 ml LV SV MOD BP:      37.5 ml RIGHT VENTRICLE RV Basal diam:  3.60 cm RV S prime:     12.90 cm/s TAPSE (M-mode): 2.0 cm LEFT ATRIUM             Index        RIGHT ATRIUM           Index LA diam:        3.80 cm 2.00 cm/m   RA Area:     13.80 cm LA Vol (A2C):   43.1 ml 22.68 ml/m  RA Volume:   34.00 ml  17.89 ml/m LA Vol (A4C):   66.6 ml 35.04 ml/m LA Biplane Vol: 55.9 ml 29.41 ml/m  AORTIC VALVE                    PULMONIC VALVE AV Area (Vmax):    3.21 cm     PV Vmax:          0.76 m/s AV Area (Vmean):   3.02 cm     PV Peak grad:     2.3 mmHg AV Area (VTI):     3.25 cm     PR End Diast Vel: 6.66 msec AV Vmax:           138.00 cm/s  AV Vmean:          94.900 cm/s AV VTI:            0.265 m AV Peak Grad:      7.6 mmHg AV Mean Grad:      4.0 mmHg LVOT Vmax:         128.00 cm/s LVOT Vmean:        82.800 cm/s LVOT VTI:          0.249 m LVOT/AV VTI ratio: 0.94  AORTA Ao Root diam: 3.30 cm Ao Asc diam:  3.30 cm MITRAL VALVE                TRICUSPID VALVE MV Area (PHT): 2.91 cm     TR Peak grad:   18.0 mmHg MV Decel Time: 261 msec     TR Vmax:        212.00 cm/s MV E velocity: 76.00 cm/s MV A velocity: 116.00 cm/s  SHUNTS MV E/A ratio:  0.66         Systemic VTI:  0.25 m                             Systemic Diam: 2.10 cm Ida Rogue MD Electronically signed by Ida Rogue MD Signature Date/Time: 04/17/2022/11:37:20 AM    Final    CT ANGIO  HEAD NECK W WO CM (CODE STROKE)  Addendum Date: 04/16/2022   ADDENDUM REPORT: 04/16/2022 14:03 ADDENDUM: The axial acquisition was repeated with satisfactory vascular opacification. The only additional information not included in the prior report is that the proximal vertebral arteries are widely patent. Electronically Signed   By: Nelson Chimes M.D.   On: 04/16/2022 14:03   Result Date: 04/16/2022 CLINICAL DATA:  Neuro deficit, acute, stroke suspected. Right-sided ataxia. EXAM: CT ANGIOGRAPHY HEAD AND NECK TECHNIQUE: Multidetector CT imaging of the head and neck was performed using the standard protocol during bolus administration of intravenous contrast. Multiplanar CT image reconstructions and MIPs were obtained to evaluate the vascular anatomy. Carotid stenosis measurements (when applicable) are obtained utilizing NASCET criteria, using the distal internal carotid diameter as the denominator. RADIATION DOSE REDUCTION: This exam was performed according to the departmental dose-optimization program which includes automated exposure control, adjustment of the mA and/or kV according to patient size and/or use of iterative reconstruction technique. CONTRAST:  66m OMNIPAQUE IOHEXOL 350 MG/ML SOLN COMPARISON:  Head CT earlier same day FINDINGS: CTA NECK FINDINGS Aortic arch: Aortic atherosclerosis. No aneurysm. Branching pattern is normal without origin stenosis. Right carotid system: Common carotid artery is tortuous but widely patent to the bifurcation. Mild plaque at the bifurcation but no stenosis. Cervical ICA widely patent to the skull base. Left carotid system: Common carotid artery widely patent to the bifurcation. Mild plaque at the bifurcation but no stenosis. Cervical ICA widely patent to the skull base. Vertebral arteries: Vertebral artery origins not well seen because of low vascular opacity and breathing motion. Both vertebral arteries show flow beyond the origins throughout the cervical region to the  foramen magnum. Skeleton: Mild spondylosis. Other neck: No mass or lymphadenopathy. Upper chest: Lung apices are clear. Review of the MIP images confirms the above findings CTA HEAD FINDINGS Anterior circulation: Contrast opacity is poor. Both internal carotid arteries are patent through the siphon regions. There is siphon atherosclerotic calcification but no stenosis greater than 30% suspected. Anterior and middle cerebral arteries show flow in their proximal extents. Distal vessels show  atherosclerotic irregularity but no visible large vessel occlusion Posterior circulation: Both vertebral arteries are patent through the foramen magnum. Mild plaque at both vertebral artery V4 segments but without stenosis greater than 30% suspected. Both vertebral arteries reach the basilar. No basilar stenosis. P posterior cerebral arteries show considerable atherosclerotic irregularity but no large branch occlusion. Venous sinuses: Poorly seen because of bolus timing. No thrombosis suspected. Anatomic variants: None significant. Review of the MIP images confirms the above findings IMPRESSION: 1. Poor contrast opacity. No large vessel occlusion. 2. Aortic atherosclerosis. 3. Mild atherosclerotic change at both carotid bifurcations but without stenosis. 4. Atherosclerotic change in both carotid siphon regions but without stenosis greater than 30%. 5. Atherosclerotic irregularity of the distal intracranial vessels but without visible large vessel occlusion. 6. Atherosclerotic irregularity of both vertebral artery V4 segments but without stenosis greater than 30%. Aortic Atherosclerosis (ICD10-I70.0). Electronically Signed: By: Nelson Chimes M.D. On: 04/16/2022 13:35   CT HEAD CODE STROKE WO CONTRAST  Result Date: 04/16/2022 CLINICAL DATA:  Code stroke.  Right-sided ataxia. EXAM: CT HEAD WITHOUT CONTRAST TECHNIQUE: Contiguous axial images were obtained from the base of the skull through the vertex without intravenous contrast.  RADIATION DOSE REDUCTION: This exam was performed according to the departmental dose-optimization program which includes automated exposure control, adjustment of the mA and/or kV according to patient size and/or use of iterative reconstruction technique. COMPARISON:  MRI Brain 05/28/21 FINDINGS: Brain: No evidence of acute infarction, hemorrhage, hydrocephalus, extra-axial collection or mass lesion/mass effect. There is a focal hypodensity in the pons which is an area of FLAIR signal abnormality seen on prior MRI brain dated 05/28/2021. Vascular: No hyperdense vessel or unexpected calcification. Skull: Normal. Negative for fracture or focal lesion. Sinuses/Orbits: No acute finding.  Bilateral lens replacement. Other: None ASPECTS ([place] Stroke Program Early CT Score):10 IMPRESSION: 1. No hemorrhage or CT evidence of a new infarct. 2. Aspects is 10. Findings were paged to Dr. Leonel Ramsay on 04/16/22 at 1:03 PM via [facility] paging system. Electronically Signed   By: Marin Roberts M.D.   On: 04/16/2022 13:04    Microbiology: Results for orders placed or performed during the hospital encounter of 04/16/22  MRSA Next Gen by PCR, Nasal     Status: None   Collection Time: 04/16/22  3:06 PM   Specimen: Nasal Mucosa; Nasal Swab  Result Value Ref Range Status   MRSA by PCR Next Gen NOT DETECTED NOT DETECTED Final    Comment: (NOTE) The GeneXpert MRSA Assay (FDA approved for NASAL specimens only), is one component of a comprehensive MRSA colonization surveillance program. It is not intended to diagnose MRSA infection nor to guide or monitor treatment for MRSA infections. Test performance is not FDA approved in patients less than 52 years old. Performed at [facility], [address] [place], [place] [address]     Labs: CBC: Recent Labs  Lab 04/16/22 1234  WBC 7.2  NEUTROABS 4.9  HGB 11.3*  HCT 34.8*  MCV 91.1  PLT 086   Basic Metabolic Panel: Recent Labs  Lab 04/16/22 1234  Gabriella [name] 136   K 4.2  CL 110  CO2 21*  GLUCOSE 213*  BUN 23  CREATININE 0.74  CALCIUM 8.8*   Liver Function Tests: Recent Labs  Lab 04/16/22 1234  AST 18  ALT 11  ALKPHOS 47  BILITOT 0.5  PROT 6.6  ALBUMIN 3.6   CBG: Recent Labs  Lab 04/17/22 1142 04/17/22 1558 04/18/22 0032 04/18/22 0833 04/18/22 1221  GLUCAP 237*  97 167* 278* 162*    Discharge time spent: greater than 30 minutes.  Signed: Jennye Boroughs, MD [facility] 04/18/2022

## 2022-04-18 NOTE — TOC Transition Note (Signed)
Transition of Care [facility]) - CM/SW Discharge Note   Patient Details  Name: Gabriella [name] MRN: [id number] Date of Birth: 1941/03/14  Transition of Care [facility] CM/SW Contact:  Quin Hoop, LCSW Phone Number: 04/18/2022, 12:39 PM   Clinical Narrative:     Patient discharge orders complete.  No TOC needs.  CSW signing off.  Final next level of care: Home/Self Care Barriers to Discharge: No Barriers Identified   Patient Goals and CMS Choice Patient states their goals for this hospitalization and ongoing recovery are:: to return home      Discharge Placement                    Patient and family notified of of transfer: 04/18/22  Discharge Plan and Services                                     Social Determinants of Health (SDOH) Interventions     Readmission Risk Interventions     No data to display

## 2022-04-18 NOTE — Plan of Care (Signed)
  Problem: Education: Goal: Knowledge of disease or condition will improve Outcome: Progressing Goal: Knowledge of secondary prevention will improve (MUST DOCUMENT ALL) Outcome: Progressing Goal: Knowledge of patient specific risk factors will improve Gabriella [name] N/A or DELETE if not current risk factor) Outcome: Progressing   Problem: Ischemic Stroke/TIA Tissue Perfusion: Goal: Complications of ischemic stroke/TIA will be minimized Outcome: Progressing   Problem: Coping: Goal: Will verbalize positive feelings about self Outcome: Progressing Goal: Will identify appropriate support needs Outcome: Progressing   Problem: Health Behavior/Discharge Planning: Goal: Ability to manage health-related needs will improve Outcome: Progressing Goal: Goals will be collaboratively established with patient/family Outcome: Progressing   Problem: Self-Care: Goal: Ability to participate in self-care as condition permits will improve Outcome: Progressing Goal: Verbalization of feelings and concerns over difficulty with self-care will improve Outcome: Progressing Goal: Ability to communicate needs accurately will improve Outcome: Progressing   Problem: Nutrition: Goal: Risk of aspiration will decrease Outcome: Progressing Goal: Dietary intake will improve Outcome: Progressing   Problem: Education: Goal: Knowledge of General Education information will improve Description: Including pain rating scale, medication(s)/side effects and non-pharmacologic comfort measures Outcome: Progressing   Problem: Health Behavior/Discharge Planning: Goal: Ability to manage health-related needs will improve Outcome: Progressing   Problem: Clinical Measurements: Goal: Ability to maintain clinical measurements within normal limits will improve Outcome: Progressing Goal: Will remain free from infection Outcome: Progressing Goal: Diagnostic test results will improve Outcome: Progressing Goal: Respiratory  complications will improve Outcome: Progressing Goal: Cardiovascular complication will be avoided Outcome: Progressing   Problem: Activity: Goal: Risk for activity intolerance will decrease Outcome: Progressing   Problem: Nutrition: Goal: Adequate nutrition will be maintained Outcome: Progressing   Problem: Coping: Goal: Level of anxiety will decrease Outcome: Progressing   Problem: Elimination: Goal: Will not experience complications related to bowel motility Outcome: Progressing Goal: Will not experience complications related to urinary retention Outcome: Progressing   Problem: Pain Managment: Goal: General experience of comfort will improve Outcome: Progressing   Problem: Safety: Goal: Ability to remain free from injury will improve Outcome: Progressing   Problem: Skin Integrity: Goal: Risk for impaired skin integrity will decrease Outcome: Progressing   Problem: Education: Goal: Ability to describe self-care measures that may prevent or decrease complications (Diabetes Survival Skills Education) will improve Outcome: Progressing Goal: Individualized Educational Video(s) Outcome: Progressing   Problem: Coping: Goal: Ability to adjust to condition or change in health will improve Outcome: Progressing   Problem: Fluid Volume: Goal: Ability to maintain a balanced intake and output will improve Outcome: Progressing   Problem: Health Behavior/Discharge Planning: Goal: Ability to identify and utilize available resources and services will improve Outcome: Progressing Goal: Ability to manage health-related needs will improve Outcome: Progressing   Problem: Metabolic: Goal: Ability to maintain appropriate glucose levels will improve Outcome: Progressing   Problem: Nutritional: Goal: Maintenance of adequate nutrition will improve Outcome: Progressing Goal: Progress toward achieving an optimal weight will improve Outcome: Progressing   Problem: Skin  Integrity: Goal: Risk for impaired skin integrity will decrease Outcome: Progressing   Problem: Tissue Perfusion: Goal: Adequacy of tissue perfusion will improve Outcome: Progressing

## 2022-04-26 DIAGNOSIS — E119 Type 2 diabetes mellitus without complications: Secondary | ICD-10-CM | POA: Diagnosis not present

## 2022-04-26 DIAGNOSIS — I69951 Hemiplegia and hemiparesis following unspecified cerebrovascular disease affecting right dominant side: Secondary | ICD-10-CM | POA: Diagnosis not present

## 2022-04-26 DIAGNOSIS — Z09 Encounter for follow-up examination after completed treatment for conditions other than malignant neoplasm: Secondary | ICD-10-CM | POA: Diagnosis not present

## 2022-05-16 DIAGNOSIS — I6523 Occlusion and stenosis of bilateral carotid arteries: Secondary | ICD-10-CM | POA: Diagnosis not present

## 2022-05-16 DIAGNOSIS — R251 Tremor, unspecified: Secondary | ICD-10-CM | POA: Diagnosis not present

## 2022-05-16 DIAGNOSIS — G629 Polyneuropathy, unspecified: Secondary | ICD-10-CM | POA: Diagnosis not present

## 2022-05-16 DIAGNOSIS — R4701 Aphasia: Secondary | ICD-10-CM | POA: Diagnosis not present

## 2022-05-23 DIAGNOSIS — N200 Calculus of kidney: Secondary | ICD-10-CM | POA: Diagnosis not present

## 2022-05-23 DIAGNOSIS — Z7984 Long term (current) use of oral hypoglycemic drugs: Secondary | ICD-10-CM | POA: Diagnosis not present

## 2022-05-23 DIAGNOSIS — I69351 Hemiplegia and hemiparesis following cerebral infarction affecting right dominant side: Secondary | ICD-10-CM | POA: Diagnosis not present

## 2022-05-23 DIAGNOSIS — K219 Gastro-esophageal reflux disease without esophagitis: Secondary | ICD-10-CM | POA: Diagnosis not present

## 2022-05-23 DIAGNOSIS — I69320 Aphasia following cerebral infarction: Secondary | ICD-10-CM | POA: Diagnosis not present

## 2022-05-23 DIAGNOSIS — G4733 Obstructive sleep apnea (adult) (pediatric): Secondary | ICD-10-CM | POA: Diagnosis not present

## 2022-05-23 DIAGNOSIS — H9192 Unspecified hearing loss, left ear: Secondary | ICD-10-CM | POA: Diagnosis not present

## 2022-05-23 DIAGNOSIS — E1140 Type 2 diabetes mellitus with diabetic neuropathy, unspecified: Secondary | ICD-10-CM | POA: Diagnosis not present

## 2022-05-23 DIAGNOSIS — F419 Anxiety disorder, unspecified: Secondary | ICD-10-CM | POA: Diagnosis not present

## 2022-05-25 DIAGNOSIS — F419 Anxiety disorder, unspecified: Secondary | ICD-10-CM | POA: Diagnosis not present

## 2022-05-25 DIAGNOSIS — I69351 Hemiplegia and hemiparesis following cerebral infarction affecting right dominant side: Secondary | ICD-10-CM | POA: Diagnosis not present

## 2022-05-25 DIAGNOSIS — H9192 Unspecified hearing loss, left ear: Secondary | ICD-10-CM | POA: Diagnosis not present

## 2022-05-25 DIAGNOSIS — G4733 Obstructive sleep apnea (adult) (pediatric): Secondary | ICD-10-CM | POA: Diagnosis not present

## 2022-05-25 DIAGNOSIS — E1140 Type 2 diabetes mellitus with diabetic neuropathy, unspecified: Secondary | ICD-10-CM | POA: Diagnosis not present

## 2022-05-25 DIAGNOSIS — Z7984 Long term (current) use of oral hypoglycemic drugs: Secondary | ICD-10-CM | POA: Diagnosis not present

## 2022-05-25 DIAGNOSIS — N200 Calculus of kidney: Secondary | ICD-10-CM | POA: Diagnosis not present

## 2022-05-25 DIAGNOSIS — I69320 Aphasia following cerebral infarction: Secondary | ICD-10-CM | POA: Diagnosis not present

## 2022-05-25 DIAGNOSIS — K219 Gastro-esophageal reflux disease without esophagitis: Secondary | ICD-10-CM | POA: Diagnosis not present

## 2022-05-27 DIAGNOSIS — Z7984 Long term (current) use of oral hypoglycemic drugs: Secondary | ICD-10-CM | POA: Diagnosis not present

## 2022-05-27 DIAGNOSIS — I69320 Aphasia following cerebral infarction: Secondary | ICD-10-CM | POA: Diagnosis not present

## 2022-05-27 DIAGNOSIS — F419 Anxiety disorder, unspecified: Secondary | ICD-10-CM | POA: Diagnosis not present

## 2022-05-27 DIAGNOSIS — I69351 Hemiplegia and hemiparesis following cerebral infarction affecting right dominant side: Secondary | ICD-10-CM | POA: Diagnosis not present

## 2022-05-27 DIAGNOSIS — K219 Gastro-esophageal reflux disease without esophagitis: Secondary | ICD-10-CM | POA: Diagnosis not present

## 2022-05-27 DIAGNOSIS — H9192 Unspecified hearing loss, left ear: Secondary | ICD-10-CM | POA: Diagnosis not present

## 2022-05-27 DIAGNOSIS — G4733 Obstructive sleep apnea (adult) (pediatric): Secondary | ICD-10-CM | POA: Diagnosis not present

## 2022-05-27 DIAGNOSIS — E1140 Type 2 diabetes mellitus with diabetic neuropathy, unspecified: Secondary | ICD-10-CM | POA: Diagnosis not present

## 2022-05-27 DIAGNOSIS — N200 Calculus of kidney: Secondary | ICD-10-CM | POA: Diagnosis not present

## 2022-05-30 DIAGNOSIS — N200 Calculus of kidney: Secondary | ICD-10-CM | POA: Diagnosis not present

## 2022-05-30 DIAGNOSIS — G4733 Obstructive sleep apnea (adult) (pediatric): Secondary | ICD-10-CM | POA: Diagnosis not present

## 2022-05-30 DIAGNOSIS — Z7984 Long term (current) use of oral hypoglycemic drugs: Secondary | ICD-10-CM | POA: Diagnosis not present

## 2022-05-30 DIAGNOSIS — I69320 Aphasia following cerebral infarction: Secondary | ICD-10-CM | POA: Diagnosis not present

## 2022-05-30 DIAGNOSIS — E1140 Type 2 diabetes mellitus with diabetic neuropathy, unspecified: Secondary | ICD-10-CM | POA: Diagnosis not present

## 2022-05-30 DIAGNOSIS — I69351 Hemiplegia and hemiparesis following cerebral infarction affecting right dominant side: Secondary | ICD-10-CM | POA: Diagnosis not present

## 2022-05-30 DIAGNOSIS — H9192 Unspecified hearing loss, left ear: Secondary | ICD-10-CM | POA: Diagnosis not present

## 2022-05-30 DIAGNOSIS — K219 Gastro-esophageal reflux disease without esophagitis: Secondary | ICD-10-CM | POA: Diagnosis not present

## 2022-05-30 DIAGNOSIS — F419 Anxiety disorder, unspecified: Secondary | ICD-10-CM | POA: Diagnosis not present

## 2022-05-31 DIAGNOSIS — I69351 Hemiplegia and hemiparesis following cerebral infarction affecting right dominant side: Secondary | ICD-10-CM | POA: Diagnosis not present

## 2022-05-31 DIAGNOSIS — Z7984 Long term (current) use of oral hypoglycemic drugs: Secondary | ICD-10-CM | POA: Diagnosis not present

## 2022-05-31 DIAGNOSIS — K219 Gastro-esophageal reflux disease without esophagitis: Secondary | ICD-10-CM | POA: Diagnosis not present

## 2022-05-31 DIAGNOSIS — E1140 Type 2 diabetes mellitus with diabetic neuropathy, unspecified: Secondary | ICD-10-CM | POA: Diagnosis not present

## 2022-05-31 DIAGNOSIS — H9192 Unspecified hearing loss, left ear: Secondary | ICD-10-CM | POA: Diagnosis not present

## 2022-05-31 DIAGNOSIS — N200 Calculus of kidney: Secondary | ICD-10-CM | POA: Diagnosis not present

## 2022-05-31 DIAGNOSIS — F419 Anxiety disorder, unspecified: Secondary | ICD-10-CM | POA: Diagnosis not present

## 2022-05-31 DIAGNOSIS — I69320 Aphasia following cerebral infarction: Secondary | ICD-10-CM | POA: Diagnosis not present

## 2022-05-31 DIAGNOSIS — G4733 Obstructive sleep apnea (adult) (pediatric): Secondary | ICD-10-CM | POA: Diagnosis not present

## 2022-06-01 DIAGNOSIS — Z0000 Encounter for general adult medical examination without abnormal findings: Secondary | ICD-10-CM | POA: Diagnosis not present

## 2022-06-01 DIAGNOSIS — Z1331 Encounter for screening for depression: Secondary | ICD-10-CM | POA: Diagnosis not present

## 2022-06-01 DIAGNOSIS — E119 Type 2 diabetes mellitus without complications: Secondary | ICD-10-CM | POA: Diagnosis not present

## 2022-06-01 DIAGNOSIS — E663 Overweight: Secondary | ICD-10-CM | POA: Diagnosis not present

## 2022-06-01 DIAGNOSIS — E1165 Type 2 diabetes mellitus with hyperglycemia: Secondary | ICD-10-CM | POA: Diagnosis not present

## 2022-06-01 DIAGNOSIS — F32A Depression, unspecified: Secondary | ICD-10-CM | POA: Diagnosis not present

## 2022-06-01 DIAGNOSIS — I69351 Hemiplegia and hemiparesis following cerebral infarction affecting right dominant side: Secondary | ICD-10-CM | POA: Diagnosis not present

## 2022-06-01 DIAGNOSIS — Z6827 Body mass index (BMI) 27.0-27.9, adult: Secondary | ICD-10-CM | POA: Diagnosis not present

## 2022-06-01 DIAGNOSIS — F3341 Major depressive disorder, recurrent, in partial remission: Secondary | ICD-10-CM | POA: Diagnosis not present

## 2022-06-02 DIAGNOSIS — I69351 Hemiplegia and hemiparesis following cerebral infarction affecting right dominant side: Secondary | ICD-10-CM | POA: Diagnosis not present

## 2022-06-02 DIAGNOSIS — Z7984 Long term (current) use of oral hypoglycemic drugs: Secondary | ICD-10-CM | POA: Diagnosis not present

## 2022-06-02 DIAGNOSIS — G4733 Obstructive sleep apnea (adult) (pediatric): Secondary | ICD-10-CM | POA: Diagnosis not present

## 2022-06-02 DIAGNOSIS — K219 Gastro-esophageal reflux disease without esophagitis: Secondary | ICD-10-CM | POA: Diagnosis not present

## 2022-06-02 DIAGNOSIS — I69320 Aphasia following cerebral infarction: Secondary | ICD-10-CM | POA: Diagnosis not present

## 2022-06-02 DIAGNOSIS — E1140 Type 2 diabetes mellitus with diabetic neuropathy, unspecified: Secondary | ICD-10-CM | POA: Diagnosis not present

## 2022-06-02 DIAGNOSIS — N200 Calculus of kidney: Secondary | ICD-10-CM | POA: Diagnosis not present

## 2022-06-02 DIAGNOSIS — F419 Anxiety disorder, unspecified: Secondary | ICD-10-CM | POA: Diagnosis not present

## 2022-06-02 DIAGNOSIS — H9192 Unspecified hearing loss, left ear: Secondary | ICD-10-CM | POA: Diagnosis not present

## 2022-06-06 DIAGNOSIS — F419 Anxiety disorder, unspecified: Secondary | ICD-10-CM | POA: Diagnosis not present

## 2022-06-06 DIAGNOSIS — H9192 Unspecified hearing loss, left ear: Secondary | ICD-10-CM | POA: Diagnosis not present

## 2022-06-06 DIAGNOSIS — I69351 Hemiplegia and hemiparesis following cerebral infarction affecting right dominant side: Secondary | ICD-10-CM | POA: Diagnosis not present

## 2022-06-06 DIAGNOSIS — E1140 Type 2 diabetes mellitus with diabetic neuropathy, unspecified: Secondary | ICD-10-CM | POA: Diagnosis not present

## 2022-06-06 DIAGNOSIS — Z7984 Long term (current) use of oral hypoglycemic drugs: Secondary | ICD-10-CM | POA: Diagnosis not present

## 2022-06-06 DIAGNOSIS — G4733 Obstructive sleep apnea (adult) (pediatric): Secondary | ICD-10-CM | POA: Diagnosis not present

## 2022-06-06 DIAGNOSIS — I69320 Aphasia following cerebral infarction: Secondary | ICD-10-CM | POA: Diagnosis not present

## 2022-06-06 DIAGNOSIS — K219 Gastro-esophageal reflux disease without esophagitis: Secondary | ICD-10-CM | POA: Diagnosis not present

## 2022-06-06 DIAGNOSIS — N200 Calculus of kidney: Secondary | ICD-10-CM | POA: Diagnosis not present

## 2022-06-08 DIAGNOSIS — H9192 Unspecified hearing loss, left ear: Secondary | ICD-10-CM | POA: Diagnosis not present

## 2022-06-08 DIAGNOSIS — I69351 Hemiplegia and hemiparesis following cerebral infarction affecting right dominant side: Secondary | ICD-10-CM | POA: Diagnosis not present

## 2022-06-08 DIAGNOSIS — N200 Calculus of kidney: Secondary | ICD-10-CM | POA: Diagnosis not present

## 2022-06-08 DIAGNOSIS — G4733 Obstructive sleep apnea (adult) (pediatric): Secondary | ICD-10-CM | POA: Diagnosis not present

## 2022-06-08 DIAGNOSIS — E1140 Type 2 diabetes mellitus with diabetic neuropathy, unspecified: Secondary | ICD-10-CM | POA: Diagnosis not present

## 2022-06-08 DIAGNOSIS — Z7984 Long term (current) use of oral hypoglycemic drugs: Secondary | ICD-10-CM | POA: Diagnosis not present

## 2022-06-08 DIAGNOSIS — K219 Gastro-esophageal reflux disease without esophagitis: Secondary | ICD-10-CM | POA: Diagnosis not present

## 2022-06-08 DIAGNOSIS — F419 Anxiety disorder, unspecified: Secondary | ICD-10-CM | POA: Diagnosis not present

## 2022-06-08 DIAGNOSIS — I69320 Aphasia following cerebral infarction: Secondary | ICD-10-CM | POA: Diagnosis not present

## 2022-06-09 DIAGNOSIS — N200 Calculus of kidney: Secondary | ICD-10-CM | POA: Diagnosis not present

## 2022-06-09 DIAGNOSIS — I69351 Hemiplegia and hemiparesis following cerebral infarction affecting right dominant side: Secondary | ICD-10-CM | POA: Diagnosis not present

## 2022-06-09 DIAGNOSIS — K219 Gastro-esophageal reflux disease without esophagitis: Secondary | ICD-10-CM | POA: Diagnosis not present

## 2022-06-09 DIAGNOSIS — H9192 Unspecified hearing loss, left ear: Secondary | ICD-10-CM | POA: Diagnosis not present

## 2022-06-09 DIAGNOSIS — E1140 Type 2 diabetes mellitus with diabetic neuropathy, unspecified: Secondary | ICD-10-CM | POA: Diagnosis not present

## 2022-06-09 DIAGNOSIS — Z7984 Long term (current) use of oral hypoglycemic drugs: Secondary | ICD-10-CM | POA: Diagnosis not present

## 2022-06-09 DIAGNOSIS — I69320 Aphasia following cerebral infarction: Secondary | ICD-10-CM | POA: Diagnosis not present

## 2022-06-09 DIAGNOSIS — G4733 Obstructive sleep apnea (adult) (pediatric): Secondary | ICD-10-CM | POA: Diagnosis not present

## 2022-06-09 DIAGNOSIS — F419 Anxiety disorder, unspecified: Secondary | ICD-10-CM | POA: Diagnosis not present

## 2022-06-13 DIAGNOSIS — Z7984 Long term (current) use of oral hypoglycemic drugs: Secondary | ICD-10-CM | POA: Diagnosis not present

## 2022-06-13 DIAGNOSIS — F419 Anxiety disorder, unspecified: Secondary | ICD-10-CM | POA: Diagnosis not present

## 2022-06-13 DIAGNOSIS — K219 Gastro-esophageal reflux disease without esophagitis: Secondary | ICD-10-CM | POA: Diagnosis not present

## 2022-06-13 DIAGNOSIS — I69351 Hemiplegia and hemiparesis following cerebral infarction affecting right dominant side: Secondary | ICD-10-CM | POA: Diagnosis not present

## 2022-06-13 DIAGNOSIS — E1140 Type 2 diabetes mellitus with diabetic neuropathy, unspecified: Secondary | ICD-10-CM | POA: Diagnosis not present

## 2022-06-13 DIAGNOSIS — G4733 Obstructive sleep apnea (adult) (pediatric): Secondary | ICD-10-CM | POA: Diagnosis not present

## 2022-06-13 DIAGNOSIS — N200 Calculus of kidney: Secondary | ICD-10-CM | POA: Diagnosis not present

## 2022-06-13 DIAGNOSIS — H9192 Unspecified hearing loss, left ear: Secondary | ICD-10-CM | POA: Diagnosis not present

## 2022-06-13 DIAGNOSIS — I69320 Aphasia following cerebral infarction: Secondary | ICD-10-CM | POA: Diagnosis not present

## 2022-06-16 DIAGNOSIS — I69320 Aphasia following cerebral infarction: Secondary | ICD-10-CM | POA: Diagnosis not present

## 2022-06-16 DIAGNOSIS — I69351 Hemiplegia and hemiparesis following cerebral infarction affecting right dominant side: Secondary | ICD-10-CM | POA: Diagnosis not present

## 2022-06-16 DIAGNOSIS — Z7984 Long term (current) use of oral hypoglycemic drugs: Secondary | ICD-10-CM | POA: Diagnosis not present

## 2022-06-16 DIAGNOSIS — F419 Anxiety disorder, unspecified: Secondary | ICD-10-CM | POA: Diagnosis not present

## 2022-06-16 DIAGNOSIS — K219 Gastro-esophageal reflux disease without esophagitis: Secondary | ICD-10-CM | POA: Diagnosis not present

## 2022-06-16 DIAGNOSIS — H9192 Unspecified hearing loss, left ear: Secondary | ICD-10-CM | POA: Diagnosis not present

## 2022-06-16 DIAGNOSIS — G4733 Obstructive sleep apnea (adult) (pediatric): Secondary | ICD-10-CM | POA: Diagnosis not present

## 2022-06-16 DIAGNOSIS — N200 Calculus of kidney: Secondary | ICD-10-CM | POA: Diagnosis not present

## 2022-06-16 DIAGNOSIS — E1140 Type 2 diabetes mellitus with diabetic neuropathy, unspecified: Secondary | ICD-10-CM | POA: Diagnosis not present

## 2022-06-17 DIAGNOSIS — Z7984 Long term (current) use of oral hypoglycemic drugs: Secondary | ICD-10-CM | POA: Diagnosis not present

## 2022-06-17 DIAGNOSIS — I69320 Aphasia following cerebral infarction: Secondary | ICD-10-CM | POA: Diagnosis not present

## 2022-06-17 DIAGNOSIS — I69351 Hemiplegia and hemiparesis following cerebral infarction affecting right dominant side: Secondary | ICD-10-CM | POA: Diagnosis not present

## 2022-06-17 DIAGNOSIS — E1140 Type 2 diabetes mellitus with diabetic neuropathy, unspecified: Secondary | ICD-10-CM | POA: Diagnosis not present

## 2022-06-17 DIAGNOSIS — H9192 Unspecified hearing loss, left ear: Secondary | ICD-10-CM | POA: Diagnosis not present

## 2022-06-17 DIAGNOSIS — G4733 Obstructive sleep apnea (adult) (pediatric): Secondary | ICD-10-CM | POA: Diagnosis not present

## 2022-06-17 DIAGNOSIS — F419 Anxiety disorder, unspecified: Secondary | ICD-10-CM | POA: Diagnosis not present

## 2022-06-17 DIAGNOSIS — K219 Gastro-esophageal reflux disease without esophagitis: Secondary | ICD-10-CM | POA: Diagnosis not present

## 2022-06-17 DIAGNOSIS — N200 Calculus of kidney: Secondary | ICD-10-CM | POA: Diagnosis not present

## 2022-06-20 IMAGING — MR MR HEAD WO/W CM
13 series · 48 of 48 positions shown · IV contrast (gadavist)
Comparison: MR head 08/02/2002 (report only)

CLINICAL DATA: Expressive aphasia most recently a few days ago,
lasted approximally 1 minute

EXAM:
MRI HEAD WITHOUT AND WITH CONTRAST
TECHNIQUE: Multiplanar, multiecho pulse sequences of the brain and surrounding
structures were obtained without and with intravenous contrast.
CONTRAST:  7.5mL GADAVIST GADOBUTROL 1 MMOL/ML IV SOLN

[Series 5: ax dwi_tracew · axial · 3.0mm · 0.65mm/px · z∈[-87,+67]mm · 4 of 48 slices shown]
[im 1/48]
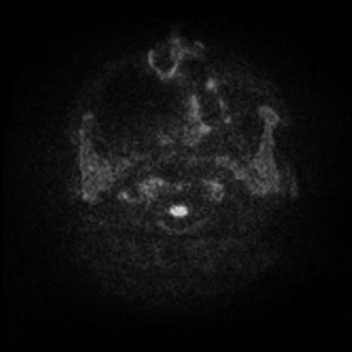
[im 16/48]
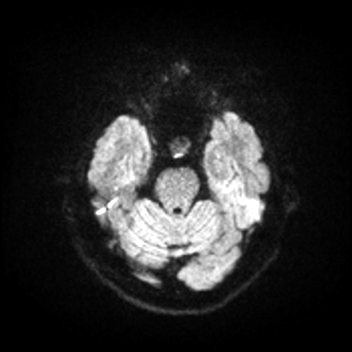
[im 32/48]
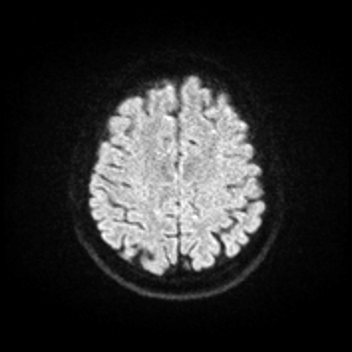
[im 48/48]
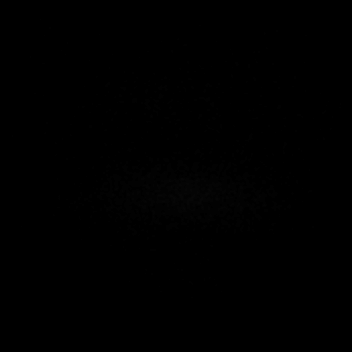

[Series 6: ax dwi_adc · axial · 3.0mm · 0.65mm/px · z∈[-87,+64]mm · 4 of 47 slices shown]
[im 1/47]
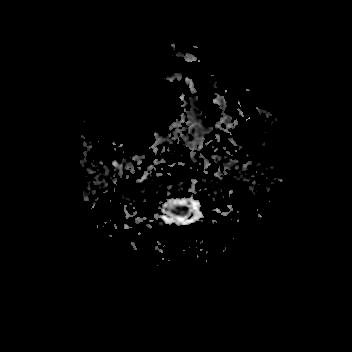
[im 16/47]
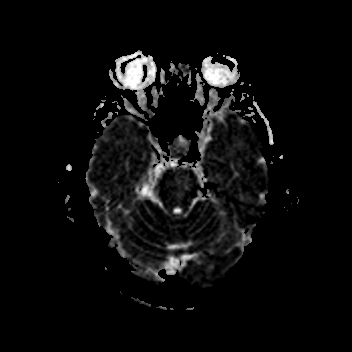
[im 31/47]
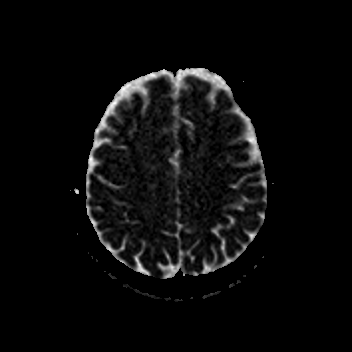
[im 47/47]
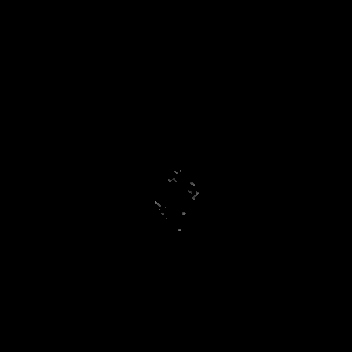

[Series 7: cor dwi_tracew · coronal · 5.0mm · 0.60mm/px · 2 of 36 slices shown]
[im 1/36]
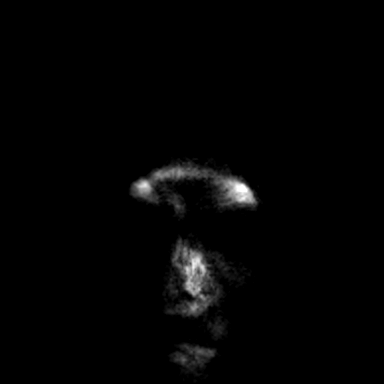
[im 36/36]
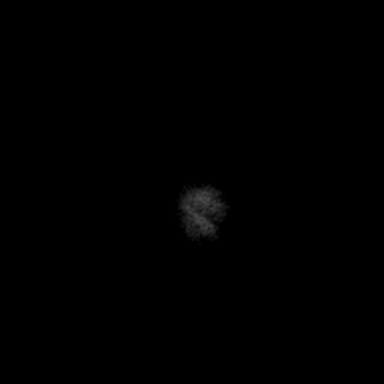

[Series 8: cor dwi_adc · coronal · 5.0mm · 0.60mm/px · 2 of 36 slices shown]
[im 1/36]
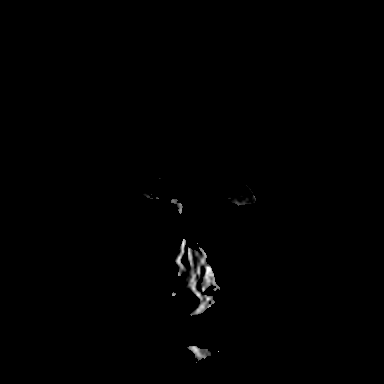
[im 36/36]
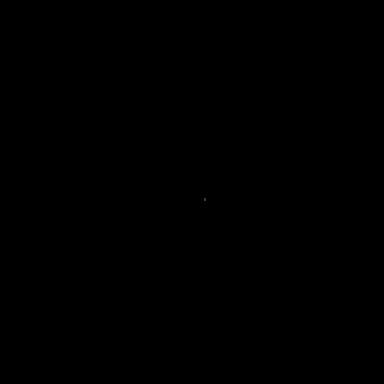

[Series 9: T1 · sagittal · 5.0mm · 0.62mm/px · 1 of 21 slices shown (1 of 2)]
[im 1/21]
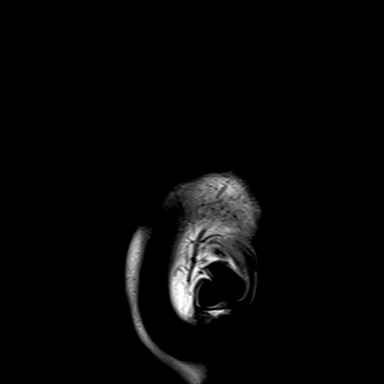

[Series 10: T2 · axial · 5.0mm · 0.53mm/px · z∈[-85,+58]mm · 2 of 25 slices shown]
[im 1/25]
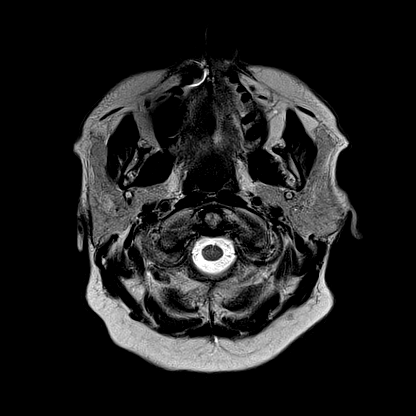
[im 25/25]
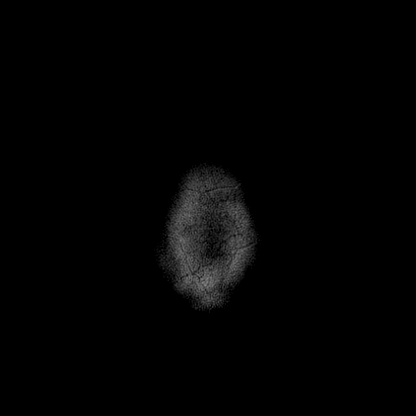

[Series 12: pha_images · axial · 3.0mm · 0.90mm/px · z∈[-88,+64]mm · 3 of 52 slices shown]
[im 1/52]
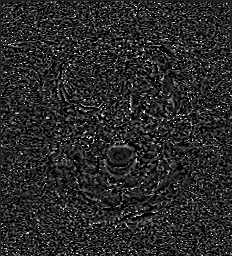
[im 26/52]
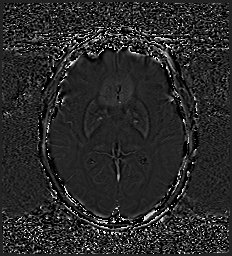
[im 52/52]
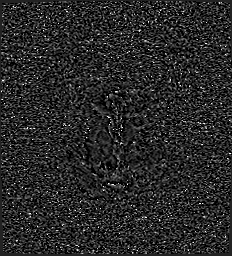

[Series 13: swi_images · axial · 3.0mm · 0.90mm/px · z∈[-88,+64]mm · 3 of 52 slices shown]
[im 1/52]
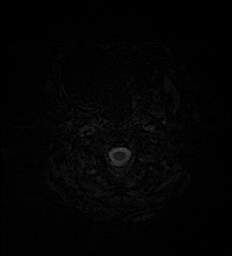
[im 26/52]
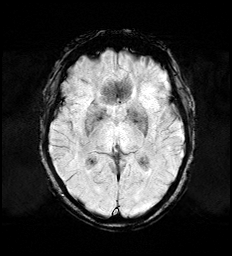
[im 52/52]
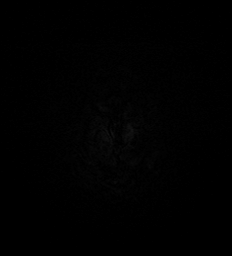

[Series 15: FLAIR · axial · 3.0mm · 0.53mm/px · z∈[-86,+60]mm · 3 of 50 slices shown]
[im 1/50]
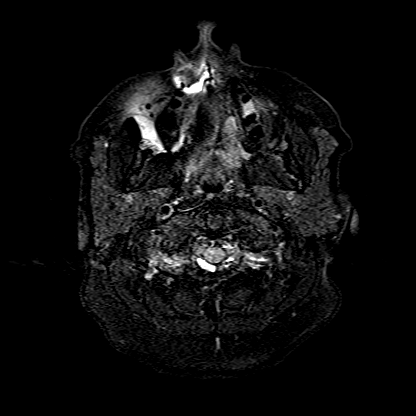
[im 25/50]
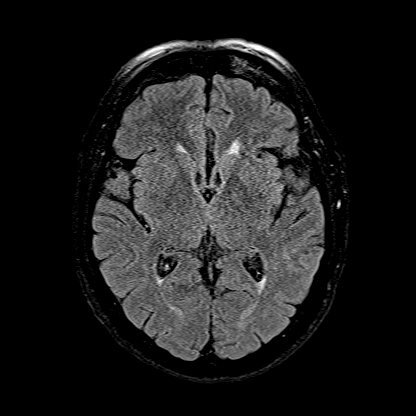
[im 50/50]
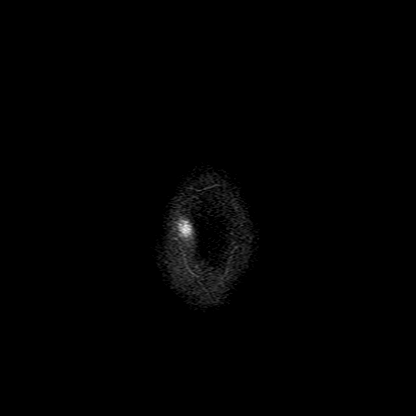

[Series 16: T1 · axial · 1.0mm · 0.98mm/px · z∈[-91,+67]mm · 10 of 160 slices shown (2 of 2)]
[im 1/160]
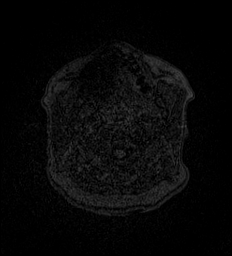
[im 18/160]
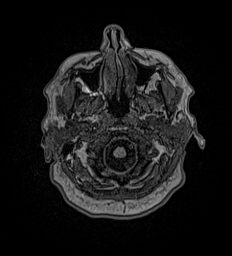
[im 36/160]
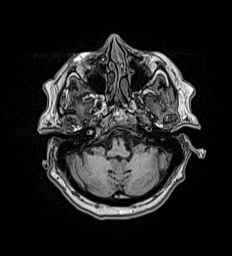
[im 54/160]
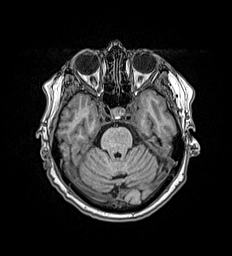
[im 71/160]
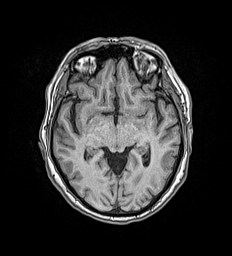
[im 89/160]
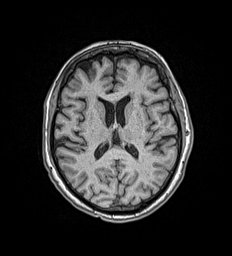
[im 107/160]
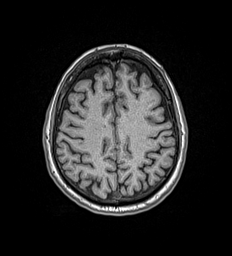
[im 124/160]
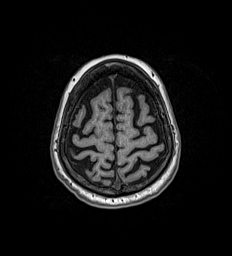
[im 142/160]
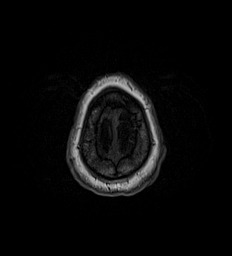
[im 160/160]
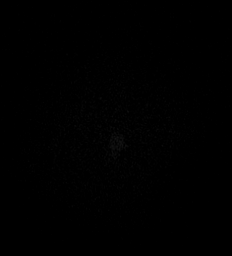

[Series 17: T2 post-contrast · coronal · 5.0mm · 0.57mm/px · 2 of 27 slices shown]
[im 1/27]
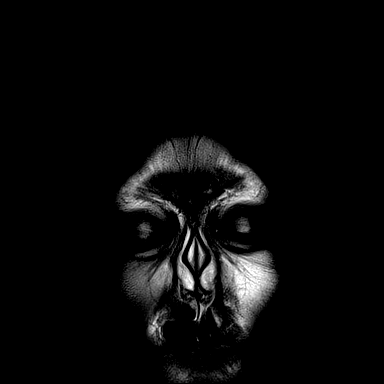
[im 27/27]
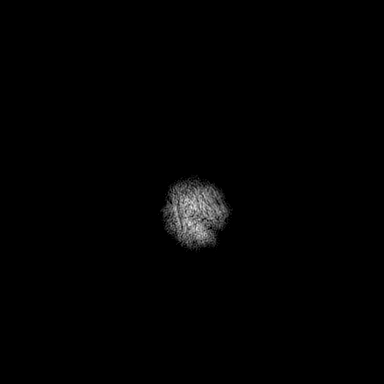

[Series 18: T1 post-contrast · axial · 1.0mm · 0.98mm/px · z∈[-91,+67]mm · 10 of 160 slices shown (1 of 2)]
[im 1/160]
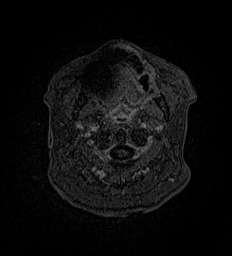
[im 18/160]
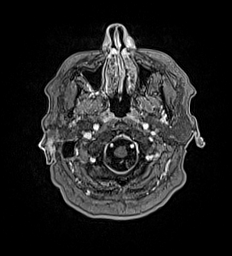
[im 36/160]
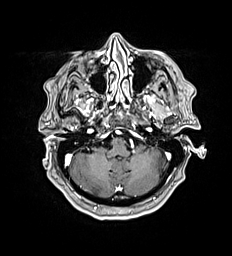
[im 54/160]
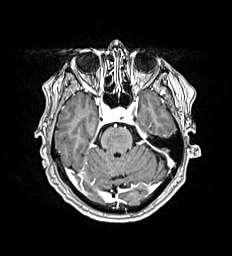
[im 71/160]
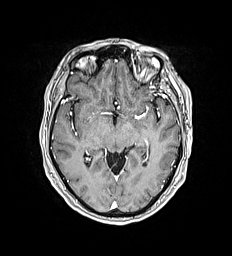
[im 89/160]
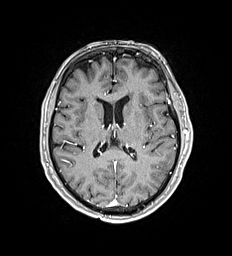
[im 107/160]
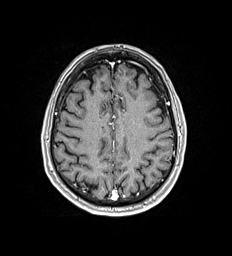
[im 124/160]
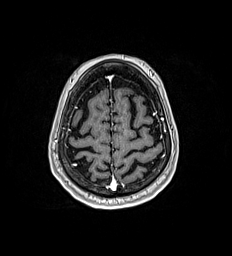
[im 142/160]
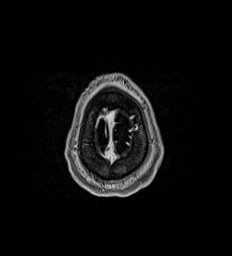
[im 160/160]
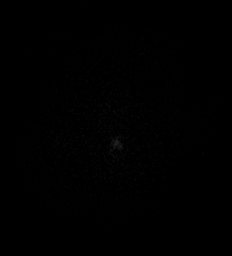

[Series 19: T1 post-contrast · coronal · 5.0mm · 0.57mm/px · 2 of 27 slices shown (2 of 2)]
[im 1/27]
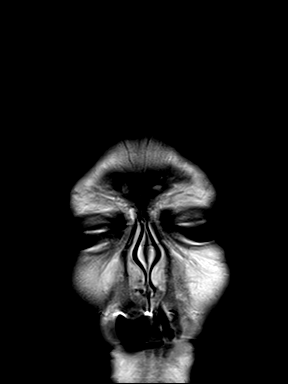
[im 27/27]
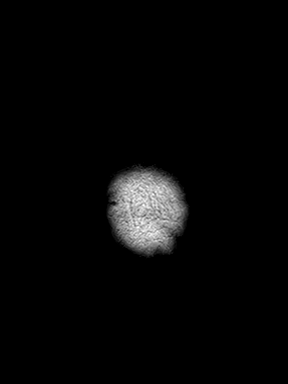

[48 of 48 positions shown; findings below may reference images not displayed]

FINDINGS: Brain: There is no evidence of acute intracranial hemorrhage,
extra-axial fluid collection, or acute infarct.

Parenchymal volume is normal. The ventricles are normal in size.
Patchy foci of FLAIR signal abnormality in the subcortical and
periventricular white matter are nonspecific but likely reflects
sequela of mild chronic white matter microangiopathy. There is SWI
signal dropout overlying the cerebellar folia and bilateral middle
cerebellar peduncles likely reflecting sequela of prior hemorrhage.

There is no mass lesion or abnormal enhancement. There is no midline
shift.

Vascular: Normal flow voids.

Skull and upper cervical spine: Normal marrow signal.

Sinuses/Orbits: There is mild mucosal thickening in the left
sphenoid sinus. Bilateral lens implants are in place. The globes and
orbits are otherwise unremarkable.

Other: None.
IMPRESSION: 1. No acute intracranial pathology.
2. Superficial siderosis overlying the bilateral cerebellar folia
likely reflecting sequela of prior hemorrhage.
3. Mild chronic white matter microangiopathy.

## 2022-06-21 DIAGNOSIS — E7800 Pure hypercholesterolemia, unspecified: Secondary | ICD-10-CM | POA: Diagnosis not present

## 2022-06-21 DIAGNOSIS — G4733 Obstructive sleep apnea (adult) (pediatric): Secondary | ICD-10-CM | POA: Diagnosis not present

## 2022-06-21 DIAGNOSIS — I639 Cerebral infarction, unspecified: Secondary | ICD-10-CM | POA: Diagnosis not present

## 2022-06-21 DIAGNOSIS — Z8673 Personal history of transient ischemic attack (TIA), and cerebral infarction without residual deficits: Secondary | ICD-10-CM | POA: Diagnosis not present

## 2022-06-22 DIAGNOSIS — E1140 Type 2 diabetes mellitus with diabetic neuropathy, unspecified: Secondary | ICD-10-CM | POA: Diagnosis not present

## 2022-06-22 DIAGNOSIS — G4733 Obstructive sleep apnea (adult) (pediatric): Secondary | ICD-10-CM | POA: Diagnosis not present

## 2022-06-22 DIAGNOSIS — H9192 Unspecified hearing loss, left ear: Secondary | ICD-10-CM | POA: Diagnosis not present

## 2022-06-22 DIAGNOSIS — N200 Calculus of kidney: Secondary | ICD-10-CM | POA: Diagnosis not present

## 2022-06-22 DIAGNOSIS — F419 Anxiety disorder, unspecified: Secondary | ICD-10-CM | POA: Diagnosis not present

## 2022-06-22 DIAGNOSIS — Z7984 Long term (current) use of oral hypoglycemic drugs: Secondary | ICD-10-CM | POA: Diagnosis not present

## 2022-06-22 DIAGNOSIS — I69351 Hemiplegia and hemiparesis following cerebral infarction affecting right dominant side: Secondary | ICD-10-CM | POA: Diagnosis not present

## 2022-06-22 DIAGNOSIS — K219 Gastro-esophageal reflux disease without esophagitis: Secondary | ICD-10-CM | POA: Diagnosis not present

## 2022-06-22 DIAGNOSIS — I69320 Aphasia following cerebral infarction: Secondary | ICD-10-CM | POA: Diagnosis not present

## 2022-06-23 DIAGNOSIS — E1140 Type 2 diabetes mellitus with diabetic neuropathy, unspecified: Secondary | ICD-10-CM | POA: Diagnosis not present

## 2022-06-23 DIAGNOSIS — K219 Gastro-esophageal reflux disease without esophagitis: Secondary | ICD-10-CM | POA: Diagnosis not present

## 2022-06-23 DIAGNOSIS — H9192 Unspecified hearing loss, left ear: Secondary | ICD-10-CM | POA: Diagnosis not present

## 2022-06-23 DIAGNOSIS — I69320 Aphasia following cerebral infarction: Secondary | ICD-10-CM | POA: Diagnosis not present

## 2022-06-23 DIAGNOSIS — Z7984 Long term (current) use of oral hypoglycemic drugs: Secondary | ICD-10-CM | POA: Diagnosis not present

## 2022-06-23 DIAGNOSIS — F419 Anxiety disorder, unspecified: Secondary | ICD-10-CM | POA: Diagnosis not present

## 2022-06-23 DIAGNOSIS — G4733 Obstructive sleep apnea (adult) (pediatric): Secondary | ICD-10-CM | POA: Diagnosis not present

## 2022-06-23 DIAGNOSIS — N200 Calculus of kidney: Secondary | ICD-10-CM | POA: Diagnosis not present

## 2022-06-23 DIAGNOSIS — I69351 Hemiplegia and hemiparesis following cerebral infarction affecting right dominant side: Secondary | ICD-10-CM | POA: Diagnosis not present

## 2022-06-27 DIAGNOSIS — K219 Gastro-esophageal reflux disease without esophagitis: Secondary | ICD-10-CM | POA: Diagnosis not present

## 2022-06-27 DIAGNOSIS — E1140 Type 2 diabetes mellitus with diabetic neuropathy, unspecified: Secondary | ICD-10-CM | POA: Diagnosis not present

## 2022-06-27 DIAGNOSIS — H9192 Unspecified hearing loss, left ear: Secondary | ICD-10-CM | POA: Diagnosis not present

## 2022-06-27 DIAGNOSIS — I69351 Hemiplegia and hemiparesis following cerebral infarction affecting right dominant side: Secondary | ICD-10-CM | POA: Diagnosis not present

## 2022-06-27 DIAGNOSIS — N200 Calculus of kidney: Secondary | ICD-10-CM | POA: Diagnosis not present

## 2022-06-27 DIAGNOSIS — G4733 Obstructive sleep apnea (adult) (pediatric): Secondary | ICD-10-CM | POA: Diagnosis not present

## 2022-06-27 DIAGNOSIS — Z7984 Long term (current) use of oral hypoglycemic drugs: Secondary | ICD-10-CM | POA: Diagnosis not present

## 2022-06-27 DIAGNOSIS — I69320 Aphasia following cerebral infarction: Secondary | ICD-10-CM | POA: Diagnosis not present

## 2022-06-27 DIAGNOSIS — F419 Anxiety disorder, unspecified: Secondary | ICD-10-CM | POA: Diagnosis not present

## 2022-08-10 DIAGNOSIS — I639 Cerebral infarction, unspecified: Secondary | ICD-10-CM | POA: Diagnosis not present

## 2022-08-10 DIAGNOSIS — Z8673 Personal history of transient ischemic attack (TIA), and cerebral infarction without residual deficits: Secondary | ICD-10-CM | POA: Diagnosis not present

## 2022-08-11 DIAGNOSIS — Z8673 Personal history of transient ischemic attack (TIA), and cerebral infarction without residual deficits: Secondary | ICD-10-CM | POA: Diagnosis not present

## 2022-08-11 DIAGNOSIS — E7800 Pure hypercholesterolemia, unspecified: Secondary | ICD-10-CM | POA: Diagnosis not present

## 2022-08-11 DIAGNOSIS — Z23 Encounter for immunization: Secondary | ICD-10-CM | POA: Diagnosis not present

## 2022-08-11 DIAGNOSIS — I700 Atherosclerosis of aorta: Secondary | ICD-10-CM | POA: Diagnosis not present

## 2022-08-16 DIAGNOSIS — Z8673 Personal history of transient ischemic attack (TIA), and cerebral infarction without residual deficits: Secondary | ICD-10-CM | POA: Diagnosis not present

## 2022-08-16 DIAGNOSIS — R251 Tremor, unspecified: Secondary | ICD-10-CM | POA: Diagnosis not present

## 2022-08-16 DIAGNOSIS — I6523 Occlusion and stenosis of bilateral carotid arteries: Secondary | ICD-10-CM | POA: Diagnosis not present

## 2022-08-16 DIAGNOSIS — G629 Polyneuropathy, unspecified: Secondary | ICD-10-CM | POA: Diagnosis not present

## 2022-08-16 DIAGNOSIS — R4701 Aphasia: Secondary | ICD-10-CM | POA: Diagnosis not present

## 2022-09-23 DIAGNOSIS — D649 Anemia, unspecified: Secondary | ICD-10-CM | POA: Diagnosis not present

## 2022-09-23 DIAGNOSIS — N390 Urinary tract infection, site not specified: Secondary | ICD-10-CM | POA: Diagnosis not present

## 2022-09-23 DIAGNOSIS — E1165 Type 2 diabetes mellitus with hyperglycemia: Secondary | ICD-10-CM | POA: Diagnosis not present

## 2022-09-23 DIAGNOSIS — Z0000 Encounter for general adult medical examination without abnormal findings: Secondary | ICD-10-CM | POA: Diagnosis not present

## 2022-09-23 DIAGNOSIS — J302 Other seasonal allergic rhinitis: Secondary | ICD-10-CM | POA: Diagnosis not present

## 2022-09-23 DIAGNOSIS — F3341 Major depressive disorder, recurrent, in partial remission: Secondary | ICD-10-CM | POA: Diagnosis not present

## 2022-09-23 DIAGNOSIS — Z6827 Body mass index (BMI) 27.0-27.9, adult: Secondary | ICD-10-CM | POA: Diagnosis not present

## 2022-09-23 DIAGNOSIS — G4733 Obstructive sleep apnea (adult) (pediatric): Secondary | ICD-10-CM | POA: Diagnosis not present

## 2022-09-30 DIAGNOSIS — J302 Other seasonal allergic rhinitis: Secondary | ICD-10-CM | POA: Diagnosis not present

## 2022-09-30 DIAGNOSIS — N390 Urinary tract infection, site not specified: Secondary | ICD-10-CM | POA: Diagnosis not present

## 2022-09-30 DIAGNOSIS — I69351 Hemiplegia and hemiparesis following cerebral infarction affecting right dominant side: Secondary | ICD-10-CM | POA: Diagnosis not present

## 2022-09-30 DIAGNOSIS — E119 Type 2 diabetes mellitus without complications: Secondary | ICD-10-CM | POA: Diagnosis not present

## 2022-09-30 DIAGNOSIS — F325 Major depressive disorder, single episode, in full remission: Secondary | ICD-10-CM | POA: Diagnosis not present

## 2022-12-22 ENCOUNTER — Other Ambulatory Visit: Payer: Self-pay | Admitting: Internal Medicine

## 2022-12-22 DIAGNOSIS — Z1231 Encounter for screening mammogram for malignant neoplasm of breast: Secondary | ICD-10-CM

## 2022-12-28 DIAGNOSIS — H26491 Other secondary cataract, right eye: Secondary | ICD-10-CM | POA: Diagnosis not present

## 2022-12-28 DIAGNOSIS — E11319 Type 2 diabetes mellitus with unspecified diabetic retinopathy without macular edema: Secondary | ICD-10-CM | POA: Diagnosis not present

## 2022-12-28 DIAGNOSIS — Z0100 Encounter for examination of eyes and vision without abnormal findings: Secondary | ICD-10-CM | POA: Diagnosis not present

## 2023-01-10 ENCOUNTER — Ambulatory Visit
Admission: RE | Admit: 2023-01-10 | Discharge: 2023-01-10 | Disposition: A | Discharge status: Home / Self Care | Payer: Medicare HMO | Source: Ambulatory Visit | Attending: Internal Medicine | Admitting: Internal Medicine

## 2023-01-10 DIAGNOSIS — Z0100 Encounter for examination of eyes and vision without abnormal findings: Secondary | ICD-10-CM | POA: Diagnosis not present

## 2023-01-10 DIAGNOSIS — Z1231 Encounter for screening mammogram for malignant neoplasm of breast: Secondary | ICD-10-CM | POA: Diagnosis not present

## 2023-01-12 ENCOUNTER — Ambulatory Visit: Payer: Medicare HMO

## 2023-02-13 ENCOUNTER — Observation Stay
Admission: EM | Admit: 2023-02-13 | Discharge: 2023-02-16 | Disposition: A | Discharge status: Home / Self Care | Payer: Medicare HMO | Attending: Obstetrics and Gynecology | Admitting: Obstetrics and Gynecology

## 2023-02-13 ENCOUNTER — Emergency Department: Payer: Medicare HMO

## 2023-02-13 DIAGNOSIS — K921 Melena: Secondary | ICD-10-CM | POA: Diagnosis not present

## 2023-02-13 DIAGNOSIS — D128 Benign neoplasm of rectum: Secondary | ICD-10-CM | POA: Insufficient documentation

## 2023-02-13 DIAGNOSIS — E278 Other specified disorders of adrenal gland: Secondary | ICD-10-CM | POA: Diagnosis not present

## 2023-02-13 DIAGNOSIS — E119 Type 2 diabetes mellitus without complications: Secondary | ICD-10-CM | POA: Insufficient documentation

## 2023-02-13 DIAGNOSIS — R110 Nausea: Secondary | ICD-10-CM | POA: Diagnosis not present

## 2023-02-13 DIAGNOSIS — D62 Acute posthemorrhagic anemia: Secondary | ICD-10-CM | POA: Insufficient documentation

## 2023-02-13 DIAGNOSIS — K5791 Diverticulosis of intestine, part unspecified, without perforation or abscess with bleeding: Secondary | ICD-10-CM | POA: Insufficient documentation

## 2023-02-13 DIAGNOSIS — N200 Calculus of kidney: Secondary | ICD-10-CM | POA: Diagnosis not present

## 2023-02-13 DIAGNOSIS — K922 Gastrointestinal hemorrhage, unspecified: Principal | ICD-10-CM | POA: Insufficient documentation

## 2023-02-13 DIAGNOSIS — K259 Gastric ulcer, unspecified as acute or chronic, without hemorrhage or perforation: Secondary | ICD-10-CM | POA: Insufficient documentation

## 2023-02-13 DIAGNOSIS — K219 Gastro-esophageal reflux disease without esophagitis: Secondary | ICD-10-CM | POA: Diagnosis present

## 2023-02-13 DIAGNOSIS — R58 Hemorrhage, not elsewhere classified: Secondary | ICD-10-CM | POA: Diagnosis not present

## 2023-02-13 DIAGNOSIS — Z79899 Other long term (current) drug therapy: Secondary | ICD-10-CM | POA: Diagnosis not present

## 2023-02-13 DIAGNOSIS — Z7984 Long term (current) use of oral hypoglycemic drugs: Secondary | ICD-10-CM | POA: Insufficient documentation

## 2023-02-13 DIAGNOSIS — K449 Diaphragmatic hernia without obstruction or gangrene: Secondary | ICD-10-CM | POA: Diagnosis not present

## 2023-02-13 DIAGNOSIS — G4733 Obstructive sleep apnea (adult) (pediatric): Secondary | ICD-10-CM | POA: Diagnosis present

## 2023-02-13 DIAGNOSIS — Z7982 Long term (current) use of aspirin: Secondary | ICD-10-CM | POA: Diagnosis not present

## 2023-02-13 DIAGNOSIS — K5731 Diverticulosis of large intestine without perforation or abscess with bleeding: Secondary | ICD-10-CM | POA: Diagnosis not present

## 2023-02-13 DIAGNOSIS — Z8673 Personal history of transient ischemic attack (TIA), and cerebral infarction without residual deficits: Secondary | ICD-10-CM | POA: Diagnosis not present

## 2023-02-13 DIAGNOSIS — K625 Hemorrhage of anus and rectum: Principal | ICD-10-CM | POA: Diagnosis present

## 2023-02-13 DIAGNOSIS — K5730 Diverticulosis of large intestine without perforation or abscess without bleeding: Secondary | ICD-10-CM | POA: Insufficient documentation

## 2023-02-13 DIAGNOSIS — K644 Residual hemorrhoidal skin tags: Secondary | ICD-10-CM | POA: Diagnosis not present

## 2023-02-13 DIAGNOSIS — I10 Essential (primary) hypertension: Secondary | ICD-10-CM | POA: Diagnosis present

## 2023-02-13 DIAGNOSIS — R739 Hyperglycemia, unspecified: Secondary | ICD-10-CM | POA: Diagnosis not present

## 2023-02-13 DIAGNOSIS — Z7901 Long term (current) use of anticoagulants: Secondary | ICD-10-CM

## 2023-02-13 DIAGNOSIS — K5750 Diverticulosis of both small and large intestine without perforation or abscess without bleeding: Secondary | ICD-10-CM | POA: Diagnosis not present

## 2023-02-13 DIAGNOSIS — I959 Hypotension, unspecified: Secondary | ICD-10-CM | POA: Diagnosis not present

## 2023-02-13 DIAGNOSIS — Z1211 Encounter for screening for malignant neoplasm of colon: Secondary | ICD-10-CM | POA: Insufficient documentation

## 2023-02-13 DIAGNOSIS — I639 Cerebral infarction, unspecified: Secondary | ICD-10-CM | POA: Diagnosis present

## 2023-02-13 DIAGNOSIS — F419 Anxiety disorder, unspecified: Secondary | ICD-10-CM | POA: Diagnosis present

## 2023-02-13 DIAGNOSIS — R1084 Generalized abdominal pain: Secondary | ICD-10-CM | POA: Diagnosis not present

## 2023-02-13 LAB — PROTIME-INR
INR: 1.2 (ref 0.8–1.2)
Prothrombin Time: 15.3 s — ABNORMAL HIGH (ref 11.4–15.2)

## 2023-02-13 LAB — PREPARE RBC (CROSSMATCH)

## 2023-02-13 LAB — CBC WITH DIFFERENTIAL/PLATELET
Abs Immature Granulocytes: 0.03 10*3/uL (ref 0.00–0.07)
Basophils Absolute: 0 10*3/uL (ref 0.0–0.1)
Basophils Relative: 0 %
Eosinophils Absolute: 0.2 10*3/uL (ref 0.0–0.5)
Eosinophils Relative: 2 %
HCT: 24.7 % — ABNORMAL LOW (ref 36.0–46.0)
Hemoglobin: 8.2 g/dL — ABNORMAL LOW (ref 12.0–15.0)
Immature Granulocytes: 0 %
Lymphocytes Relative: 14 %
Lymphs Abs: 1.2 10*3/uL (ref 0.7–4.0)
MCH: 30.3 pg (ref 26.0–34.0)
MCHC: 33.2 g/dL (ref 30.0–36.0)
MCV: 91.1 fL (ref 80.0–100.0)
Monocytes Absolute: 0.4 10*3/uL (ref 0.1–1.0)
Monocytes Relative: 5 %
Neutro Abs: 6.6 10*3/uL (ref 1.7–7.7)
Neutrophils Relative %: 79 %
Platelets: 165 10*3/uL (ref 150–400)
RBC: 2.71 MIL/uL — ABNORMAL LOW (ref 3.87–5.11)
RDW: 12.7 % (ref 11.5–15.5)
WBC: 8.5 10*3/uL (ref 4.0–10.5)
nRBC: 0 % (ref 0.0–0.2)

## 2023-02-13 LAB — COMPREHENSIVE METABOLIC PANEL
ALT: 12 U/L (ref 0–44)
AST: 13 U/L — ABNORMAL LOW (ref 15–41)
Albumin: 2.8 g/dL — ABNORMAL LOW (ref 3.5–5.0)
Alkaline Phosphatase: 43 U/L (ref 38–126)
Anion gap: 4 — ABNORMAL LOW (ref 5–15)
BUN: 28 mg/dL — ABNORMAL HIGH (ref 8–23)
CO2: 20 mmol/L — ABNORMAL LOW (ref 22–32)
Calcium: 7.7 mg/dL — ABNORMAL LOW (ref 8.9–10.3)
Chloride: 113 mmol/L — ABNORMAL HIGH (ref 98–111)
Creatinine, Ser: 0.86 mg/dL (ref 0.44–1.00)
GFR, Estimated: >60 mL/min (ref >60)
Glucose, Bld: 228 mg/dL — ABNORMAL HIGH (ref 70–99)
Potassium: 3.8 mmol/L (ref 3.5–5.1)
Sodium: 137 mmol/L (ref 135–145)
Total Bilirubin: 0.7 mg/dL (ref 0.3–1.2)
Total Protein: 5 g/dL — ABNORMAL LOW (ref 6.5–8.1)

## 2023-02-13 LAB — HEMOGLOBIN A1C
Hgb A1c MFr Bld: 7.3 % — ABNORMAL HIGH (ref 4.8–5.6)
Mean Plasma Glucose: 162.81 mg/dL

## 2023-02-13 LAB — FERRITIN: Ferritin: 9 ng/mL — ABNORMAL LOW (ref 11–307)

## 2023-02-13 LAB — HEMOGLOBIN AND HEMATOCRIT, BLOOD
HCT: 23.3 % — ABNORMAL LOW (ref 36.0–46.0)
Hemoglobin: 7.6 g/dL — ABNORMAL LOW (ref 12.0–15.0)

## 2023-02-13 LAB — VITAMIN B12: Vitamin B-12: 637 pg/mL (ref 180–914)

## 2023-02-13 LAB — CBG MONITORING, ED
Glucose-Capillary: 212 mg/dL — ABNORMAL HIGH (ref 70–99)
Glucose-Capillary: 191 mg/dL — ABNORMAL HIGH (ref 70–99)

## 2023-02-13 LAB — RETICULOCYTES
Immature Retic Fract: 20 % — ABNORMAL HIGH (ref 2.3–15.9)
RBC.: 2.46 MIL/uL — ABNORMAL LOW (ref 3.87–5.11)
Retic Count, Absolute: 40.8 10*3/uL (ref 19.0–186.0)
Retic Ct Pct: 1.7 % (ref 0.4–3.1)

## 2023-02-13 LAB — IRON AND TIBC
Iron: 38 ug/dL (ref 28–170)
Saturation Ratios: 13 % (ref 10.4–31.8)
TIBC: 295 ug/dL (ref 250–450)
UIBC: 257 ug/dL

## 2023-02-13 LAB — ABO/RH: ABO/RH(D): O POS

## 2023-02-13 LAB — FOLATE: Folate: 9.9 ng/mL (ref >5.9)

## 2023-02-13 MED ORDER — LISINOPRIL 10 MG PO TABS
5.0000 mg | ORAL_TABLET | Freq: Every day | ORAL | Status: DC
  Administered 2023-02-13 – 2023-02-15 (×3): 5 mg via ORAL
  Filled 2023-02-13 (×3): qty 1

## 2023-02-13 MED ORDER — IOHEXOL 350 MG/ML SOLN
100.0000 mL | Freq: Once | INTRAVENOUS | Status: AC | PRN
  Administered 2023-02-13: 100 mL via INTRAVENOUS

## 2023-02-13 MED ORDER — ONDANSETRON HCL 4 MG PO TABS: 4.0000 mg | ORAL_TABLET | Freq: Four times a day (QID) | ORAL | Status: DC | PRN

## 2023-02-13 MED ORDER — ASPIRIN 81 MG PO TBEC
81.0000 mg | DELAYED_RELEASE_TABLET | Freq: Every day | ORAL | Status: DC
  Administered 2023-02-13: 81 mg via ORAL
  Filled 2023-02-13: qty 1

## 2023-02-13 MED ORDER — ACETAMINOPHEN 500 MG PO TABS: 500.0000 mg | ORAL_TABLET | Freq: Four times a day (QID) | ORAL | Status: DC | PRN

## 2023-02-13 MED ORDER — SODIUM CHLORIDE 0.9% IV SOLUTION
Freq: Once | INTRAVENOUS | Status: AC
  Filled 2023-02-13: qty 250

## 2023-02-13 MED ORDER — SODIUM CHLORIDE 0.9 % IV SOLN: Freq: Once | INTRAVENOUS | Status: AC

## 2023-02-13 MED ORDER — ESCITALOPRAM OXALATE 10 MG PO TABS
20.0000 mg | ORAL_TABLET | Freq: Every day | ORAL | Status: DC
  Administered 2023-02-13 – 2023-02-16 (×4): 20 mg via ORAL
  Filled 2023-02-13 (×4): qty 2

## 2023-02-13 MED ORDER — ATORVASTATIN CALCIUM 20 MG PO TABS
40.0000 mg | ORAL_TABLET | Freq: Every day | ORAL | Status: DC
  Administered 2023-02-13 – 2023-02-16 (×4): 40 mg via ORAL
  Filled 2023-02-13 (×4): qty 2

## 2023-02-13 MED ORDER — FLUTICASONE PROPIONATE 50 MCG/ACT NA SUSP: 1.0000 | Freq: Every day | NASAL | Status: DC | PRN

## 2023-02-13 MED ORDER — INSULIN ASPART 100 UNIT/ML IJ SOLN
0.0000 [IU] | Freq: Three times a day (TID) | INTRAMUSCULAR | Status: DC
  Administered 2023-02-13: 3 [IU] via SUBCUTANEOUS
  Administered 2023-02-13 – 2023-02-14 (×2): 5 [IU] via SUBCUTANEOUS
  Administered 2023-02-14 (×2): 3 [IU] via SUBCUTANEOUS
  Administered 2023-02-15: 5 [IU] via SUBCUTANEOUS
  Administered 2023-02-15: 3 [IU] via SUBCUTANEOUS
  Administered 2023-02-15 – 2023-02-16 (×2): 5 [IU] via SUBCUTANEOUS
  Administered 2023-02-16: 2 [IU] via SUBCUTANEOUS
  Filled 2023-02-13 (×9): qty 1

## 2023-02-13 MED ORDER — PANTOPRAZOLE SODIUM 40 MG PO TBEC
40.0000 mg | DELAYED_RELEASE_TABLET | Freq: Every day | ORAL | Status: DC
  Administered 2023-02-13: 40 mg via ORAL
  Filled 2023-02-13: qty 1

## 2023-02-13 MED ORDER — ONDANSETRON HCL 4 MG/2ML IJ SOLN
4.0000 mg | Freq: Four times a day (QID) | INTRAMUSCULAR | Status: DC | PRN
  Administered 2023-02-15: 4 mg via INTRAVENOUS
  Filled 2023-02-13: qty 2

## 2023-02-13 NOTE — Progress Notes (Signed)
Hb down trending, 8.2>7.6, one unit PRBC ordered.

## 2023-02-13 NOTE — ED Triage Notes (Signed)
Patient BIB [facility] from home. Patient reports trying to have a BM around midnight and started bleeding out of rectum. Patient reports that bleeding has been ongoing since midnight. Denies pain.

## 2023-02-13 NOTE — ED Provider Notes (Signed)
[facility] Provider Note    Event Date/Time   First MD Initiated Contact with Patient 02/13/23 0710     (approximate)   History   Rectal Bleeding   HPI  Gabriella [name] is a 82 y.o. female  here with rectal bleeding. Pt reports that earlier today, she awoke and felt the need to have a large BM. She sat down and passed a large amount of bright red blood mixed with small clots. Since then, she has had ongoing bleeding. No major abd pain. No fever, chills. Reports a h/o diverticulitis but has not had bleeding. 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 No SOB, lightheadedness. No chest pain.       Physical Exam   Triage Vital Signs: ED Triage Vitals  Encounter Vitals Group     BP 02/13/23 0649 (!) 149/62     Systolic BP Percentile --      Diastolic BP Percentile --      Pulse Rate 02/13/23 0649 (!) 59     Resp 02/13/23 0649 (!) 22     Temp 02/13/23 0655 97.9 F (36.6 C)     Temp Source 02/13/23 0655 Oral     SpO2 02/13/23 0649 100 %     Weight 02/13/23 0650 172 lb 13.5 oz (78.4 kg)     Height 02/13/23 0650 5\' 7"  (1.702 m)     Head Circumference --      Peak Flow --      Pain Score 02/13/23 0650 0     Pain Loc --      Pain Education --      Exclude from Growth Chart --     Most recent vital signs: Vitals:   02/13/23 0655 02/13/23 0957  BP:    Pulse:    Resp:    Temp: 97.9 F (36.6 C) 97.7 F (36.5 C)  SpO2:       General: Awake, no distress.  CV:  Good peripheral perfusion. RRR. Resp:  Normal work of breathing. Lungs clear. Abd:  No distention. No tenderness. Normal bowel sounds. No rebound or guarding. Other:  Grossly bloody stool noted in rectal vault and around buttocks.   ED Results / Procedures / Treatments   Labs (all labs ordered are listed, but only abnormal results are displayed) Labs Reviewed  COMPREHENSIVE METABOLIC PANEL - Abnormal; Notable for the following components:      Result Value   Chloride 113 (*)    CO2 20  (*)    Glucose, Bld 228 (*)    BUN 28 (*)    Calcium 7.7 (*)    Total Protein 5.0 (*)    Albumin 2.8 (*)    AST 13 (*)    Anion gap 4 (*)    All other components within normal limits  CBC WITH DIFFERENTIAL/PLATELET - Abnormal; Notable for the following components:   RBC 2.71 (*)    Hemoglobin 8.2 (*)    HCT 24.7 (*)    All other components within normal limits  PROTIME-INR - Abnormal; Notable for the following components:   Prothrombin Time 15.3 (*)    All other components within normal limits  TYPE AND SCREEN     EKG Normal sinus rhythm, VR 60. PR 198, QRS 131, QTc 499. No acute ST elevations or depressions. No ischemia or infarct. RBBB.   RADIOLOGY CT Angio: No evidence of active extra have, extensive colonic diverticulosis   I also independently reviewed and  agree with radiologist interpretations.   PROCEDURES:  Critical Care performed: No    MEDICATIONS ORDERED IN ED: Medications  0.9 %  sodium chloride infusion ( Intravenous New Bag/Given 02/13/23 0952)  iohexol (OMNIPAQUE) 350 MG/ML injection 100 mL (100 mLs Intravenous Contrast Given 02/13/23 0809)     IMPRESSION / MDM / ASSESSMENT AND PLAN / ED COURSE  I reviewed the triage vital signs and the nursing notes.                              Differential diagnosis includes, but is not limited to, diverticulosis, diverticulitis, AVM, internal hemorrhoids, unlikely UGIB/PUD, colon mass/CA, proctitis.  Patient's presentation is most consistent with acute presentation with potential threat to life or bodily function.  The patient is on the cardiac monitor to evaluate for evidence of arrhythmia and/or significant heart rate changes   82 yo F with h/o HTN, T2DM, CVA on ASA/plavix here with BRBPR. Likely diverticular bleed, versus IH. Pt is HDS and has no CP, SOB, palpitations. Initial labs obtained, remarkable for hemoglobin 8.2, down from 11.3 in December 2023.  BUN slightly elevated at 28 and relative creatinine  0.86.  She is mildly dehydrated.  Mild hyperglycemia noted but no gap.  INR is 1.2.. CT Angio obtained given degree of bleeding on anticoagulation, and shows no active extravasation..  Will admit to medicine for monitoring, GI consultation as indicated.  Suspect diverticular bleed   FINAL CLINICAL IMPRESSION(S) / ED DIAGNOSES   Final diagnoses:  Lower GI bleed  Anticoagulated     Rx / DC Orders   ED Discharge Orders     None        Note:  This document was prepared using Dragon voice recognition software and may include unintentional dictation errors.   Shaune Pollack, MD 02/13/23 1041

## 2023-02-13 NOTE — ED Notes (Signed)
Per Dr. Irving Burton, "pt has a lower GI bleed and probably has stopped at this time." Pt is okay to get Aspirin as ordered.

## 2023-02-13 NOTE — H&P (Signed)
History and Physical    Gabriella [name] HQI:[id number] DOB: 1940-08-03 DOA: 02/13/2023  PCP: Barbette Reichmann, MD (Confirm with patient/family/NH records and if not entered, this has to be entered at [facility] point of entry) Patient coming from: Home  I have personally briefly reviewed patient's old medical records in [facility]  Chief Complaint: Rectal bleed  HPI: Gabriella [name] is a 82 y.o. 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  Patient woke up last night started to pass bright blood per rectum, small to medium amount x 6 episodes, denied any abdominal pain but did feel nauseous and vomited x 1 en route to ED.  No chest pain shortness of breath no lightheadedness or blurry vision.  Last episode of rectal bleeding was 6 AM.  Patient reported that she has no history of GI bleeding and she reported that she has a chronic on and off diarrhea which she attributed to chronic use of metformin.  ED Course: Borderline bradycardia, blood pressure 150/60 nonhypoxic.  CTA showed no active bleeding identified.  Blood work showed hemoglobin 8.2 compared to baseline 11.3  Review of Systems: As per HPI otherwise 14 point review of systems negative.    Past Medical History:  Diagnosis Date   Allergic rhinitis    Anxiety    Arthritis    Deaf, left    Diabetes mellitus without complication (HCC)    GERD (gastroesophageal reflux disease)    History of kidney stones    Sleep apnea    OSA---HAS C-PAP BUT   DOES NOT USE    Past Surgical History:  Procedure Laterality Date   BREAST BIOPSY Right 2008   stereo-benign   BREAST BIOPSY Left 05/17/2016   usual ductal hyperplasia   BREAST CYST ASPIRATION Left    BREAST EXCISIONAL BIOPSY Left 12/13/2016   neg   BREAST LUMPECTOMY Left 12/13/2016   BREAST LUMPECTOMY WITH NEEDLE LOCALIZATION Left 12/13/2016   Procedure:  BREAST LUMPECTOMY WITH NEEDLE LOCALIZATION;  Surgeon: Nadeen Landau, MD;  Location: [facility] ORS;  Service: General;  Laterality: Left;   COLONOSCOPY WITH PROPOFOL N/A 05/31/2017   Procedure: COLONOSCOPY WITH PROPOFOL;  Surgeon: Toledo, Boykin Nearing, MD;  Location: [facility] ENDOSCOPY;  Service: Gastroenterology;  Laterality: N/A;   EYE SURGERY Bilateral    Cataract Extraction with IOL   TONSILLECTOMY     TUBAL LIGATION       reports that she has never smoked. She has never used smokeless tobacco. She reports that she does not drink alcohol and does not use drugs.  No Known Allergies  Family History  Problem Relation Age of Onset   Diabetes Mother    Dementia Mother    Lung cancer Father    Breast cancer Neg Hx      Prior to Admission medications   Medication Sig Start Date End Date Taking? Authorizing Provider  acetaminophen (TYLENOL) 500 MG tablet Take 500-1,000 mg by mouth every 6 (six) hours as needed (for pain.).   Yes [provider]  aspirin EC 81 MG tablet Take 1 tablet (81 mg total) by mouth daily. Swallow whole. 04/19/22  Yes Lurene Shadow, MD  atorvastatin (LIPITOR) 40 MG tablet Take 1 tablet (40 mg total) by mouth daily. 04/19/22  Yes Lurene Shadow, MD  escitalopram (LEXAPRO) 20 MG tablet Take 20 mg by mouth daily.   Yes [provider]  fluticasone Aleda Grana)  50 MCG/ACT nasal spray Place 1 spray into both nostrils daily as needed (for allergies).    Yes [provider]  glimepiride (AMARYL) 4 MG tablet Take 4 mg by mouth daily with breakfast.   Yes [provider]  lisinopril (ZESTRIL) 5 MG tablet Take 1 tablet (5 mg total) by mouth daily. 04/18/22 04/18/23 Yes Lurene Shadow, MD  metFORMIN (GLUCOPHAGE) 500 MG tablet Take 500 mg by mouth daily.   Yes [provider]  omeprazole (PRILOSEC) 20 MG capsule Take 20 mg by mouth daily.   Yes [provider]  clopidogrel (PLAVIX) 75 MG tablet Take 1 tablet (75 mg total) by mouth  daily. Patient not taking: Reported on 02/13/2023 04/19/22   Lurene Shadow, MD    Physical Exam: Vitals:   02/13/23 0930 02/13/23 0957 02/13/23 1000 02/13/23 1030  BP: (!) 152/72  (!) 143/66 (!) 157/64  Pulse: 73  66 68  Resp: (!) 21  13 19   Temp:  97.7 F (36.5 C)    TempSrc:  Oral    SpO2: 95%  100% 99%  Weight:      Height:        Constitutional: NAD, calm, comfortable Vitals:   02/13/23 0930 02/13/23 0957 02/13/23 1000 02/13/23 1030  BP: (!) 152/72  (!) 143/66 (!) 157/64  Pulse: 73  66 68  Resp: (!) 21  13 19   Temp:  97.7 F (36.5 C)    TempSrc:  Oral    SpO2: 95%  100% 99%  Weight:      Height:       Eyes: PERRL, lids and conjunctivae normal ENMT: Mucous membranes are moist. Posterior pharynx clear of any exudate or lesions.Normal dentition.  Neck: normal, supple, no masses, no thyromegaly Respiratory: clear to auscultation bilaterally, no wheezing, no crackles. Normal respiratory effort. No accessory muscle use.  Cardiovascular: Regular rate and rhythm, no murmurs / rubs / gallops. No extremity edema. 2+ pedal pulses. No carotid bruits.  Abdomen: no tenderness, no masses palpated. No hepatosplenomegaly. Bowel sounds positive.  Musculoskeletal: no clubbing / cyanosis. No joint deformity upper and lower extremities. Good ROM, no contractures. Normal muscle tone.  Skin: no rashes, lesions, ulcers. No induration Neurologic: CN 2-12 grossly intact. Sensation intact, DTR normal. Strength 5/5 in all 4.  Psychiatric: Normal judgment and insight. Alert and oriented x 3. Normal mood.     Labs on Admission: I have personally reviewed following labs and imaging studies  CBC: Recent Labs  Lab 02/13/23 0730  WBC 8.5  NEUTROABS 6.6  HGB 8.2*  HCT 24.7*  MCV 91.1  PLT 165   Basic Metabolic Panel: Recent Labs  Lab 02/13/23 0730  NA 137  K 3.8  CL 113*  CO2 20*  GLUCOSE 228*  BUN 28*  CREATININE 0.86  CALCIUM 7.7*   GFR: Estimated Creatinine Clearance: 54.4  mL/min (by C-G formula based on SCr of 0.86 mg/dL). Liver Function Tests: Recent Labs  Lab 02/13/23 0730  AST 13*  ALT 12  ALKPHOS 43  BILITOT 0.7  PROT 5.0*  ALBUMIN 2.8*   No results for input(s): "LIPASE", "AMYLASE" in the last 168 hours. No results for input(s): "AMMONIA" in the last 168 hours. Coagulation Profile: Recent Labs  Lab 02/13/23 0730  INR 1.2   Cardiac Enzymes: No results for input(s): "CKTOTAL", "CKMB", "CKMBINDEX", "TROPONINI" in the last 168 hours. BNP (last 3 results) No results for input(s): "PROBNP" in the last 8760 hours. HbA1C: No results for input(s): "HGBA1C" in the  last 72 hours. CBG: Recent Labs  Lab 02/13/23 1156  GLUCAP 212*   Lipid Profile: No results for input(s): "CHOL", "HDL", "LDLCALC", "TRIG", "CHOLHDL", "LDLDIRECT" in the last 72 hours. Thyroid Function Tests: No results for input(s): "TSH", "T4TOTAL", "FREET4", "T3FREE", "THYROIDAB" in the last 72 hours. Anemia Panel: No results for input(s): "VITAMINB12", "FOLATE", "FERRITIN", "TIBC", "IRON", "RETICCTPCT" in the last 72 hours. Urine analysis: No results found for: "COLORURINE", "APPEARANCEUR", "LABSPEC", "PHURINE", "GLUCOSEU", "HGBUR", "BILIRUBINUR", "KETONESUR", "PROTEINUR", "UROBILINOGEN", "NITRITE", "LEUKOCYTESUR"  Radiological Exams on Admission: CT ANGIO GI BLEED  Result Date: 02/13/2023 CLINICAL DATA:  Active GI bleed, likely diverticular. Bleeding per rectum. EXAM: CTA ABDOMEN AND PELVIS WITHOUT AND WITH CONTRAST TECHNIQUE: Multidetector CT imaging of the abdomen and pelvis was performed using the standard protocol during bolus administration of intravenous contrast. Multiplanar reconstructed images and MIPs were obtained and reviewed to evaluate the vascular anatomy. RADIATION DOSE REDUCTION: This exam was performed according to the departmental dose-optimization program which includes automated exposure control, adjustment of the mA and/or kV according to patient size and/or  use of iterative reconstruction technique. CONTRAST:  OMNIPAQUE IOHEXOL 350 MG/ML SOLN COMPARISON:  None Available. FINDINGS: VASCULAR Aorta: Normal caliber aorta without aneurysm, dissection, vasculitis or significant stenosis. There are 2 moderate calcified and noncalcified plaque throughout the aorta and its major branches. Celiac: Patent without evidence of aneurysm, dissection, vasculitis or significant stenosis. SMA: Patent without evidence of aneurysm, dissection, vasculitis or significant stenosis. Renals: Both renal arteries are patent without evidence of aneurysm, dissection, vasculitis, fibromuscular dysplasia or significant stenosis. IMA: Patent without evidence of aneurysm, dissection, vasculitis or significant stenosis. Inflow: Patent without evidence of aneurysm, dissection, vasculitis or significant stenosis. Proximal Outflow: Bilateral common femoral and visualized portions of the superficial and profunda femoral arteries are patent without evidence of aneurysm, dissection, vasculitis or significant stenosis. Veins: No obvious venous abnormality within the limitations of this arterial phase study. Review of the MIP images confirms the above findings. NON-VASCULAR Lower chest: There are subpleural atelectatic changes in the visualized lung bases. No overt consolidation. No pleural effusion. The heart is normal in size. No pericardial effusion. Hepatobiliary: The liver is normal in size. Non-cirrhotic configuration. No suspicious mass. No intrahepatic or extrahepatic bile duct dilation. There is a single predominantly calcified gallstone in the fundal region without imaging signs of acute cholecystitis. Normal gallbladder wall thickness. No pericholecystic inflammatory changes. Pancreas: Unremarkable. No pancreatic ductal dilatation or surrounding inflammatory changes. Spleen: Within normal limits. No focal lesion. Adrenals/Urinary Tract: There are indeterminate 11 x 17 mm left adrenal and 8 x  11 mm right adrenal nodules. These can not be characterized as adenoma on the basis of the exam. These may represent lipid poor adenomas however, indeterminate on the basis of this exam. No suspicious renal mass. No hydronephrosis. There is a 9 x 15 mm left nonobstructing calculus in the left kidney lower pole calyx. No other nephroureterolithiasis on either side. Bilateral extrarenal pelvis noted. Unremarkable urinary bladder. Stomach/Bowel: Moderate sized hiatal hernia noted. There is a diverticulum arising from the second/third part of duodenum. No disproportionate dilation of the small or large bowel loops. No evidence of abnormal bowel wall thickening or inflammatory changes. The appendix is unremarkable. There are multiple diverticula throughout the colon with asymmetric more involvement of the sigmoid colon. No evidence of active extravasation of contrast noted. Vascular/Lymphatic: No ascites or pneumoperitoneum. No abdominal or pelvic lymphadenopathy, by size criteria. No aneurysmal dilation of the major abdominal arteries. There are moderate peripheral atherosclerotic vascular calcifications  of the aorta and its major branches. Reproductive: The uterus is unremarkable. No large adnexal mass. Other: There is a tiny fat containing umbilical hernia. The soft tissues and abdominal wall are otherwise unremarkable. Musculoskeletal: No suspicious osseous lesions. There are mild - moderate multilevel degenerative changes in the visualized spine. IMPRESSION: 1. No evidence of active extravasation of contrast to suggest the presence of an active GI bleed. 2. Extensive colonic diverticulosis without superimposed acute inflammatory change. 3. Nonobstructing left renal calculus. 4. Indeterminate bilateral adrenal nodules. These may represent lipid poor adenomas however, indeterminate on the basis of this exam. If indicated, this could be confirmed with dedicated adrenal protocol CT or MRI examination. 5. Multiple other  nonacute observations, as described above. Aortic Atherosclerosis (ICD10-I70.0). Electronically Signed   By: Jules Schick M.D.   On: 02/13/2023 08:50    EKG: Independently reviewed.  Sinus, chronic RBBB, no acute ST changes.  Assessment/Plan Principal Problem:   Rectal bleeding Active Problems:   Acute blood loss anemia   Diverticulosis of intestine with bleeding  (please populate well all problems here in Problem List. (For example, if patient is on BP meds at home and you resume or decide to hold them, it is a problem that needs to be her. Same for CAD, COPD, HLD and so on)  Hematochezia -Clinically suspect diverticulosis bleeding -Clinically and image study or implying that the bleeding has been self-limiting and stopped more than 6 hours ago.  Continue to monitor H&H, this afternoon and evening and transfuse for hemodynamic instability. -Plan to keep patient in hospital observation for 24 hours, once her H&H stabilized for more than 24 hours IVH can go home and outpatient follow-up with GI for elective colonoscopy. -Avoid constipation  Hx of stroke -Plan to continue aspirin monitor H&H  HTN -BP stable, continue lisinopril  IIDM with hyperglycemia -SSI -Discontinue metformin and recommend she talk to her PCP/endocrinology about switching to other p.o. diabetic medications.  Patient expressed understanding and agreed.  DVT prophylaxis: SCD Code Status: Full code Family Communication: Son at bedside Disposition Plan: Expect less than 2 midnight hospital stay Consults called: None Admission status: Telemetry observation   Emeline General MD [facility] Pager [phone]  02/13/2023, 12:20 PM

## 2023-02-13 NOTE — ED Triage Notes (Signed)
Patient reports vomiting X1  prior to EMS arrival

## 2023-02-13 NOTE — ED Notes (Signed)
RN reached out to provider for clarification about pt getting aspirin due to pt having a lower GI bleed.

## 2023-02-13 NOTE — ED Notes (Signed)
Pt assisted to use bed pan, x1 urination. New brief place and pt lights turned down per request

## 2023-02-14 ENCOUNTER — Other Ambulatory Visit: Payer: Self-pay

## 2023-02-14 ENCOUNTER — Encounter: Payer: Self-pay | Admitting: Internal Medicine

## 2023-02-14 DIAGNOSIS — E119 Type 2 diabetes mellitus without complications: Secondary | ICD-10-CM | POA: Diagnosis not present

## 2023-02-14 DIAGNOSIS — I10 Essential (primary) hypertension: Secondary | ICD-10-CM | POA: Diagnosis not present

## 2023-02-14 DIAGNOSIS — K922 Gastrointestinal hemorrhage, unspecified: Principal | ICD-10-CM

## 2023-02-14 DIAGNOSIS — K219 Gastro-esophageal reflux disease without esophagitis: Secondary | ICD-10-CM | POA: Diagnosis not present

## 2023-02-14 DIAGNOSIS — D62 Acute posthemorrhagic anemia: Secondary | ICD-10-CM

## 2023-02-14 DIAGNOSIS — K625 Hemorrhage of anus and rectum: Secondary | ICD-10-CM | POA: Diagnosis not present

## 2023-02-14 LAB — CBC
HCT: 25.3 % — ABNORMAL LOW (ref 36.0–46.0)
Hemoglobin: 8.6 g/dL — ABNORMAL LOW (ref 12.0–15.0)
MCH: 30.4 pg (ref 26.0–34.0)
MCHC: 34 g/dL (ref 30.0–36.0)
MCV: 89.4 fL (ref 80.0–100.0)
Platelets: 166 10*3/uL (ref 150–400)
RBC: 2.83 MIL/uL — ABNORMAL LOW (ref 3.87–5.11)
RDW: 13.7 % (ref 11.5–15.5)
WBC: 7.8 10*3/uL (ref 4.0–10.5)
nRBC: 0 % (ref 0.0–0.2)

## 2023-02-14 LAB — GLUCOSE, CAPILLARY
Glucose-Capillary: 171 mg/dL — ABNORMAL HIGH (ref 70–99)
Glucose-Capillary: 214 mg/dL — ABNORMAL HIGH (ref 70–99)
Glucose-Capillary: 153 mg/dL — ABNORMAL HIGH (ref 70–99)
Glucose-Capillary: 166 mg/dL — ABNORMAL HIGH (ref 70–99)

## 2023-02-14 LAB — BASIC METABOLIC PANEL
Anion gap: 3 — ABNORMAL LOW (ref 5–15)
BUN: 17 mg/dL (ref 8–23)
CO2: 24 mmol/L (ref 22–32)
Calcium: 8.1 mg/dL — ABNORMAL LOW (ref 8.9–10.3)
Chloride: 112 mmol/L — ABNORMAL HIGH (ref 98–111)
Creatinine, Ser: 0.71 mg/dL (ref 0.44–1.00)
GFR, Estimated: >60 mL/min (ref >60)
Glucose, Bld: 161 mg/dL — ABNORMAL HIGH (ref 70–99)
Potassium: 4 mmol/L (ref 3.5–5.1)
Sodium: 139 mmol/L (ref 135–145)

## 2023-02-14 LAB — BPAM RBC
Blood Product Expiration Date: 202411052359
ISSUE DATE / TIME: 202410071651
Unit Type and Rh: 5100

## 2023-02-14 LAB — HEMOGLOBIN AND HEMATOCRIT, BLOOD
HCT: 24.7 % — ABNORMAL LOW (ref 36.0–46.0)
HCT: 26.1 % — ABNORMAL LOW (ref 36.0–46.0)
Hemoglobin: 8.4 g/dL — ABNORMAL LOW (ref 12.0–15.0)
Hemoglobin: 9 g/dL — ABNORMAL LOW (ref 12.0–15.0)

## 2023-02-14 LAB — TYPE AND SCREEN
ABO/RH(D): O POS
Antibody Screen: NEGATIVE
Unit division: 0

## 2023-02-14 LAB — LACTATE DEHYDROGENASE: LDH: 91 U/L — ABNORMAL LOW (ref 98–192)

## 2023-02-14 LAB — CBG MONITORING, ED: Glucose-Capillary: 178 mg/dL — ABNORMAL HIGH (ref 70–99)

## 2023-02-14 MED ORDER — FE FUM-VIT C-VIT B12-FA 460-60-0.01-1 MG PO CAPS
1.0000 | ORAL_CAPSULE | Freq: Every day | ORAL | Status: DC
  Administered 2023-02-14 – 2023-02-15 (×2): 1 via ORAL
  Filled 2023-02-14 (×2): qty 1

## 2023-02-14 MED ORDER — PANTOPRAZOLE SODIUM 40 MG PO TBEC
40.0000 mg | DELAYED_RELEASE_TABLET | Freq: Two times a day (BID) | ORAL | Status: DC
  Administered 2023-02-14 – 2023-02-16 (×5): 40 mg via ORAL
  Filled 2023-02-14 (×5): qty 1

## 2023-02-14 NOTE — Assessment & Plan Note (Signed)
Continue PPI ?

## 2023-02-14 NOTE — Progress Notes (Signed)
Progress Note   Patient: Gabriella [name]:[id number] DOB: 05/14/1940 DOA: 02/13/2023     0 DOS: the patient was seen and examined on 02/14/2023   Brief hospital course: Taken from H&P.  Gabriella [name] is a 82 y.o. 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  Patient had approximately 6 episodes of small to medium amount of bright red blood per rectum.  Some nausea and vomited x 1 en route to ED. History of chronic intermittent diarrhea which she attributed to chronic use of metformin.  No prior history of GI bleed.  On presentation she had borderline bradycardia, BP 150/60.  CTA of abdomen with no active bleeding identified.  Hemoglobin initially 8.2 as compared to the baseline of 11.3 and later decreased to 7.3 so 1 unit of PRBC ordered.  Suspecting diverticular bleed, as imaging shows self-limiting and stopped bleeding for few hours.  Plan was to observe and if remained stable without any more bleeding then outpatient GI follow-up for elective colonoscopy.  10/8: Vital stable.  Hemoglobin at 8.6 s/p 1 unit of PRBC.  Anemia panel with normal iron stores but low ferritin.  Folate and B12 normal, starting on supplement. Patient had another bloody bowel movement, repeat hemoglobin 8.4.  GI is recommending RBC tagged scan if she continues to have bleeding.  Observing for another day.    Assessment and Plan: * Rectal bleeding Likely diverticular bleed.  Patient had another episode but hemoglobin seems stable, s/p 1 unit of PRBC.  GI is on board Patient will need RBC tagged scan if continued to bleed. GI to decide about colonoscopy, likely in 1 to 2 days, Plavix was listed in her medications but she has stopped taking them.  Aspirin is currently on hold. -Continue to monitor hemoglobin -Transfuse if below 8  Acute blood loss anemia Anemia panel essentially normal  except low ferritin level. -Started on supplement  Type 2 diabetes mellitus without complication, without long-term current use of insulin (HCC) Patient was experiencing chronic diarrhea due to metformin-recommend to stop it and discuss with PCP for other p.o. options. -Continue with SSI  Essential hypertension Blood pressure within goal. -Continue lisinopril  GERD (gastroesophageal reflux disease) -Continue PPI  Anxiety -Continue home Lexapro   Subjective: Patient was seen and examined today.  No new concern.  There was no further bleeding at the time of rounding. Later on in the afternoon patient had another episode of bloody bowel movement.  Physical Exam: Vitals:   02/14/23 0200 02/14/23 0500 02/14/23 0637 02/14/23 0858  BP: 132/60 131/74  (!) 143/70  Pulse: 78 77  74  Resp: 19 19  16   Temp:   97.9 F (36.6 C) 97.7 F (36.5 C)  TempSrc:   Oral Oral  SpO2: 93% 92%  100%  Weight:      Height:       General.  Frail elderly lady, in no acute distress. Pulmonary.  Lungs clear bilaterally, normal respiratory effort. CV.  Regular rate and rhythm, no JVD, rub or murmur. Abdomen.  Soft, nontender, nondistended, BS positive. CNS.  Alert and oriented .  No focal neurologic deficit. Extremities.  No edema, no cyanosis, pulses intact and symmetrical. Psychiatry.  Judgment and insight appears normal.   Data Reviewed: Prior data reviewed  Family Communication: Discussed with son and brother at bedside  Disposition: Status is: Observation The patient will require care spanning >  2 midnights and should be moved to inpatient because: Severity of illness  Planned Discharge Destination: Home  Time spent: 45 minutes  This record has been created using Conservation officer, historic buildings. Errors have been sought and corrected,but may not always be located. Such creation errors do not reflect on the standard of care.   Author: Arnetha Courser, MD 02/14/2023 3:19 PM  For on call  review [email or web address].

## 2023-02-14 NOTE — Hospital Course (Addendum)
Taken from H&P.  Gabriella [name] is a 82 y.o. 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  Patient had approximately 6 episodes of small to medium amount of bright red blood per rectum.  Some nausea and vomited x 1 en route to ED. History of chronic intermittent diarrhea which she attributed to chronic use of metformin.  No prior history of GI bleed.  On presentation she had borderline bradycardia, BP 150/60.  CTA of abdomen with no active bleeding identified.  Hemoglobin initially 8.2 as compared to the baseline of 11.3 and later decreased to 7.3 so 1 unit of PRBC ordered.  Suspecting diverticular bleed, as imaging shows self-limiting and stopped bleeding for few hours.  Plan was to observe and if remained stable without any more bleeding then outpatient GI follow-up for elective colonoscopy.  10/8: Vital stable.  Hemoglobin at 8.6 s/p 1 unit of PRBC.  Anemia panel with normal iron stores but low ferritin.  Folate and B12 normal, starting on supplement. Patient had another bloody bowel movement, repeat hemoglobin 8.4.  GI is recommending RBC tagged scan if she continues to have bleeding.  Observing for another day.

## 2023-02-14 NOTE — Assessment & Plan Note (Signed)
Blood pressure within goal. -Continue lisinopril

## 2023-02-14 NOTE — Assessment & Plan Note (Signed)
Patient was experiencing chronic diarrhea due to metformin-recommend to stop it and discuss with PCP for other p.o. options. -Continue with SSI

## 2023-02-14 NOTE — Assessment & Plan Note (Signed)
-

## 2023-02-14 NOTE — Assessment & Plan Note (Signed)
Anemia panel essentially normal except low ferritin level. -Started on supplement

## 2023-02-14 NOTE — Consult Note (Signed)
Gabriella Repress, MD [address]  [address]  [place], [place] [address]  Main: [phone]  Fax: [phone] Pager: [phone]   Consultation  Referring Provider:     No ref. provider found Primary Care Physician:  Barbette Reichmann, MD Primary Gastroenterologist:  Dr. Norma Fredrickson         Reason for Consultation: Hematochezia  Date of Admission:  02/13/2023 Date of Consultation:  02/14/2023         HPI:   Gabriella [name] is a 82 y.o. female with history of diabetes, hypertension, chronic GERD, acute stroke with right-sided weakness on 04/16/2022 status post tPA, on aspirin and Plavix since then presented to ER yesterday morning after she had an episode of rectal bleeding since yesterday midnight.  She tried to have a bowel movement and noticed large amount of bright red blood per rectum mixed with clots.  She denies any abdominal pain.  Had 1 episode of nonbloody emesis.  Reports past history of diverticulitis.  She has known history of severe pancolonic diverticulosis, underwent screening colonoscopy in 05/2017 by Dr. Norma Fredrickson.  Labs revealed hemoglobin 8.2, down from 11.3 since December 2023, elevated BUN/creatinine 28/0.86 compared to baseline.  She underwent CT angio abdomen and pelvis did not reveal any active extravasation other than severe pancolonic diverticulosis.  Both aspirin and Plavix have been held since admission.  Her hemoglobin dropped to 7.6, received 1 unit of PRBCs, responded appropriately to 8.6.  BUN/creatinine is normal.  GI is consulted for further evaluation.  Patient had another episode of large-volume bloody bowel movement today afternoon witnessed by her nurse Patient denies any constipation at baseline.  She reports having diarrhea. Labs revealed severe iron deficiency anemia, started on oral iron today.  Normal B12 and folate levels   NSAIDs: None  Antiplts/Anticoagulants/Anti thrombotics: Aspirin and Plavix for history of stroke  GI Procedures: Screening  colonoscopy by Dr. Norma Fredrickson 05/31/2017 - Diverticulosis in the left colon. - One 15 mm polyp in the distal sigmoid colon, removed with a hot snare. Resected and retrieved. - Non- bleeding internal hemorrhoids. - The examination was otherwise normal. DIAGNOSIS:  A. COLON POLYP, DISTAL SIGMOID; HOT SNARE:  - POLYP WITH CRYPT HYPERPLASIA AND DILATATION, INFLAMMATION, AND SMOOTH  MUSCLE PROLIFERATION, SEE COMMENT.  - NEGATIVE FOR DYSPLASIA AND MALIGNANCY.   Past Medical History:  Diagnosis Date   Allergic rhinitis    Anxiety    Arthritis    Deaf, left    Diabetes mellitus without complication (HCC)    GERD (gastroesophageal reflux disease)    History of kidney stones    Sleep apnea    OSA---HAS C-PAP BUT   DOES NOT USE    Past Surgical History:  Procedure Laterality Date   BREAST BIOPSY Right 2008   stereo-benign   BREAST BIOPSY Left 05/17/2016   usual ductal hyperplasia   BREAST CYST ASPIRATION Left    BREAST EXCISIONAL BIOPSY Left 12/13/2016   neg   BREAST LUMPECTOMY Left 12/13/2016   BREAST LUMPECTOMY WITH NEEDLE LOCALIZATION Left 12/13/2016   Procedure: BREAST LUMPECTOMY WITH NEEDLE LOCALIZATION;  Surgeon: Nadeen Landau, MD;  Location: [facility] ORS;  Service: General;  Laterality: Left;   COLONOSCOPY WITH PROPOFOL N/A 05/31/2017   Procedure: COLONOSCOPY WITH PROPOFOL;  Surgeon: Toledo, Boykin Nearing, MD;  Location: [facility];  Service: Gastroenterology;  Laterality: N/A;   EYE SURGERY Bilateral    Cataract Extraction with IOL   TONSILLECTOMY     TUBAL LIGATION  Current Facility-Administered Medications:    acetaminophen (TYLENOL) tablet 500-1,000 mg, 500-1,000 mg, Oral, Q6H PRN, Mikey College T, MD   atorvastatin (LIPITOR) tablet 40 mg, 40 mg, Oral, Daily, Chipper Herb, Ping T, MD, 40 mg at 02/14/23 0946   escitalopram (LEXAPRO) tablet 20 mg, 20 mg, Oral, Daily, Mikey College T, MD, 20 mg at 02/14/23 0945   Fe Fum-Vit C-Vit B12-FA (TRIGELS-F FORTE) capsule 1 capsule, 1  capsule, Oral, QPC breakfast, Arnetha Courser, MD, 1 capsule at 02/14/23 0946   fluticasone (FLONASE) 50 MCG/ACT nasal spray 1 spray, 1 spray, Each Nare, Daily PRN, Mikey College T, MD   insulin aspart (novoLOG) injection 0-15 Units, 0-15 Units, Subcutaneous, TID WC, Mikey College T, MD, 5 Units at 02/14/23 1209   lisinopril (ZESTRIL) tablet 5 mg, 5 mg, Oral, Daily, Mikey College T, MD, 5 mg at 02/14/23 0946   ondansetron (ZOFRAN) tablet 4 mg, 4 mg, Oral, Q6H PRN **OR** ondansetron (ZOFRAN) injection 4 mg, 4 mg, Intravenous, Q6H PRN, Chipper Herb, Ping T, MD   pantoprazole (PROTONIX) EC tablet 40 mg, 40 mg, Oral, BID AC, Matthews Franks, Loel Dubonnet, MD   Family History  Problem Relation Age of Onset   Diabetes Mother    Dementia Mother    Lung cancer Father    Breast cancer Neg Hx      Social History   Tobacco Use   Smoking status: Never   Smokeless tobacco: Never  Vaping Use   Vaping status: Never Used  Substance Use Topics   Alcohol use: No   Drug use: No    Allergies as of 02/13/2023   (No Known Allergies)    Review of Systems:    All systems reviewed and negative except where noted in HPI.   Physical Exam:  Vital signs in last 24 hours: Temp:  [97.4 F (36.3 C)-98.5 F (36.9 C)] 97.7 F (36.5 C) (10/08 0858) Pulse Rate:  [69-80] 74 (10/08 0858) Resp:  [11-29] 16 (10/08 0858) BP: (118-147)/(59-89) 143/70 (10/08 0858) SpO2:  [92 %-100 %] 100 % (10/08 0858) Last BM Date : 02/13/23 (with blood per pt) General:   Pleasant, cooperative in NAD Head:  Normocephalic and atraumatic. Eyes:   No icterus.   Conjunctiva pink. PERRLA. Ears:  Normal auditory acuity. Neck:  Supple; no masses or thyroidomegaly Lungs: Respirations even and unlabored. Lungs clear to auscultation bilaterally.   No wheezes, crackles, or rhonchi.  Heart:  Regular rate and rhythm;  Without murmur, clicks, rubs or gallops Abdomen:  Soft, nondistended, nontender. Normal bowel sounds. No appreciable masses or hepatomegaly.   No rebound or guarding.  Rectal:  Not performed. Msk:  Symmetrical without gross deformities.  Strength generalized weakness Extremities:  Without edema, cyanosis or clubbing. Neurologic:  Alert and oriented x3;  grossly normal neurologically. Skin:  Intact without significant lesions or rashes. Psych:  Alert and cooperative. Normal affect.  LAB RESULTS:    Latest Ref Rng & Units 02/14/2023   12:53 PM 02/14/2023    4:46 AM 02/14/2023   12:27 AM  CBC  WBC 4.0 - 10.5 K/uL  7.8    Hemoglobin 12.0 - 15.0 g/dL 8.4  8.6  9.0   Hematocrit 36.0 - 46.0 % 24.7  25.3  26.1   Platelets 150 - 400 K/uL  166      BMET    Latest Ref Rng & Units 02/14/2023    4:46 AM 02/13/2023    7:30 AM 04/16/2022   12:34 PM  BMP  Glucose 70 - 99  mg/dL 409  811  914   BUN 8 - 23 mg/dL 17  28  23    Creatinine 0.44 - 1.00 mg/dL 7.82  9.56  2.13   Sodium 135 - 145 mmol/L 139  137  136   Potassium 3.5 - 5.1 mmol/L 4.0  3.8  4.2   Chloride 98 - 111 mmol/L 112  113  110   CO2 22 - 32 mmol/L 24  20  21    Calcium 8.9 - 10.3 mg/dL 8.1  7.7  8.8     LFT    Latest Ref Rng & Units 02/13/2023    7:30 AM 04/16/2022   12:34 PM 12/05/2016   11:18 AM  Hepatic Function  Total Protein 6.5 - 8.1 g/dL 5.0  6.6  6.8   Albumin 3.5 - 5.0 g/dL 2.8  3.6  3.8   AST 15 - 41 U/L 13  18  15    ALT 0 - 44 U/L 12  11  14    Alk Phosphatase 38 - 126 U/L 43  47  72   Total Bilirubin 0.3 - 1.2 mg/dL 0.7  0.5  0.5      STUDIES: CT ANGIO GI BLEED  Result Date: 02/13/2023 CLINICAL DATA:  Active GI bleed, likely diverticular. Bleeding per rectum. EXAM: CTA ABDOMEN AND PELVIS WITHOUT AND WITH CONTRAST TECHNIQUE: Multidetector CT imaging of the abdomen and pelvis was performed using the standard protocol during bolus administration of intravenous contrast. Multiplanar reconstructed images and MIPs were obtained and reviewed to evaluate the vascular anatomy. RADIATION DOSE REDUCTION: This exam was performed according to the departmental  dose-optimization program which includes automated exposure control, adjustment of the mA and/or kV according to patient size and/or use of iterative reconstruction technique. CONTRAST:  OMNIPAQUE IOHEXOL 350 MG/ML SOLN COMPARISON:  None Available. FINDINGS: VASCULAR Aorta: Normal caliber aorta without aneurysm, dissection, vasculitis or significant stenosis. There are 2 moderate calcified and noncalcified plaque throughout the aorta and its major branches. Celiac: Patent without evidence of aneurysm, dissection, vasculitis or significant stenosis. SMA: Patent without evidence of aneurysm, dissection, vasculitis or significant stenosis. Renals: Both renal arteries are patent without evidence of aneurysm, dissection, vasculitis, fibromuscular dysplasia or significant stenosis. IMA: Patent without evidence of aneurysm, dissection, vasculitis or significant stenosis. Inflow: Patent without evidence of aneurysm, dissection, vasculitis or significant stenosis. Proximal Outflow: Bilateral common femoral and visualized portions of the superficial and profunda femoral arteries are patent without evidence of aneurysm, dissection, vasculitis or significant stenosis. Veins: No obvious venous abnormality within the limitations of this arterial phase study. Review of the MIP images confirms the above findings. NON-VASCULAR Lower chest: There are subpleural atelectatic changes in the visualized lung bases. No overt consolidation. No pleural effusion. The heart is normal in size. No pericardial effusion. Hepatobiliary: The liver is normal in size. Non-cirrhotic configuration. No suspicious mass. No intrahepatic or extrahepatic bile duct dilation. There is a single predominantly calcified gallstone in the fundal region without imaging signs of acute cholecystitis. Normal gallbladder wall thickness. No pericholecystic inflammatory changes. Pancreas: Unremarkable. No pancreatic ductal dilatation or surrounding inflammatory  changes. Spleen: Within normal limits. No focal lesion. Adrenals/Urinary Tract: There are indeterminate 11 x 17 mm left adrenal and 8 x 11 mm right adrenal nodules. These can not be characterized as adenoma on the basis of the exam. These may represent lipid poor adenomas however, indeterminate on the basis of this exam. No suspicious renal mass. No hydronephrosis. There is a 9 x 15 mm left nonobstructing  calculus in the left kidney lower pole calyx. No other nephroureterolithiasis on either side. Bilateral extrarenal pelvis noted. Unremarkable urinary bladder. Stomach/Bowel: Moderate sized hiatal hernia noted. There is a diverticulum arising from the second/third part of duodenum. No disproportionate dilation of the small or large bowel loops. No evidence of abnormal bowel wall thickening or inflammatory changes. The appendix is unremarkable. There are multiple diverticula throughout the colon with asymmetric more involvement of the sigmoid colon. No evidence of active extravasation of contrast noted. Vascular/Lymphatic: No ascites or pneumoperitoneum. No abdominal or pelvic lymphadenopathy, by size criteria. No aneurysmal dilation of the major abdominal arteries. There are moderate peripheral atherosclerotic vascular calcifications of the aorta and its major branches. Reproductive: The uterus is unremarkable. No large adnexal mass. Other: There is a tiny fat containing umbilical hernia. The soft tissues and abdominal wall are otherwise unremarkable. Musculoskeletal: No suspicious osseous lesions. There are mild - moderate multilevel degenerative changes in the visualized spine. IMPRESSION: 1. No evidence of active extravasation of contrast to suggest the presence of an active GI bleed. 2. Extensive colonic diverticulosis without superimposed acute inflammatory change. 3. Nonobstructing left renal calculus. 4. Indeterminate bilateral adrenal nodules. These may represent lipid poor adenomas however, indeterminate on  the basis of this exam. If indicated, this could be confirmed with dedicated adrenal protocol CT or MRI examination. 5. Multiple other nonacute observations, as described above. Aortic Atherosclerosis (ICD10-I70.0). Electronically Signed   By: Jules Schick M.D.   On: 02/13/2023 08:50      Impression / Plan:   Gabriella [name] is a 82 y.o. female with history of diabetes, hypertension, stroke with right-sided weakness in 04/2022, status post tPA on DAPT, pancolonic diverticulosis is admitted with large episode of bright red blood per rectum with acute blood loss anemia  Rectal bleeding, acute blood loss anemia Most likely diverticular bleed CT angio negative for active extravasation Status post 1 unit of PRBCs, hemoglobin responded appropriately Another episode of rectal bleeding this afternoon, H&H relatively stable Aspirin and Plavix are held since 10/7 Recommend neurology to comment on long-term need for DAPT since her stroke was more than 6 months ago in setting of clinically significant diverticular bleed Okay to continue liquid diet Monitor CBC closely to maintain hemoglobin above 8 If patient continues to bleed, recommend tagged RBC scan to locate the source of bleeding and possible embolization.  If this is negative, we will proceed with colonoscopy after holding Plavix for 5 days at least  Thank you for involving me in the care of this patient.      LOS: 0 days   Lannette Donath, MD  02/14/2023, 2:53 PM    Note: This dictation was prepared with Dragon dictation along with smaller phrase technology. Any transcriptional errors that result from this process are unintentional.

## 2023-02-14 NOTE — Assessment & Plan Note (Signed)
Likely diverticular bleed.  Patient had another episode but hemoglobin seems stable, s/p 1 unit of PRBC.  GI is on board Patient will need RBC tagged scan if continued to bleed. GI to decide about colonoscopy, likely in 1 to 2 days, Plavix was listed in her medications but she has stopped taking them.  Aspirin is currently on hold. -Continue to monitor hemoglobin -Transfuse if below 8

## 2023-02-15 DIAGNOSIS — D62 Acute posthemorrhagic anemia: Secondary | ICD-10-CM | POA: Diagnosis not present

## 2023-02-15 DIAGNOSIS — K625 Hemorrhage of anus and rectum: Secondary | ICD-10-CM | POA: Diagnosis not present

## 2023-02-15 LAB — GLUCOSE, CAPILLARY
Glucose-Capillary: 190 mg/dL — ABNORMAL HIGH (ref 70–99)
Glucose-Capillary: 226 mg/dL — ABNORMAL HIGH (ref 70–99)
Glucose-Capillary: 247 mg/dL — ABNORMAL HIGH (ref 70–99)
Glucose-Capillary: 239 mg/dL — ABNORMAL HIGH (ref 70–99)

## 2023-02-15 LAB — CBC
HCT: 22.7 % — ABNORMAL LOW (ref 36.0–46.0)
HCT: 25.5 % — ABNORMAL LOW (ref 36.0–46.0)
Hemoglobin: 8 g/dL — ABNORMAL LOW (ref 12.0–15.0)
Hemoglobin: 8.5 g/dL — ABNORMAL LOW (ref 12.0–15.0)
MCH: 30.5 pg (ref 26.0–34.0)
MCH: 30.1 pg (ref 26.0–34.0)
MCHC: 35.2 g/dL (ref 30.0–36.0)
MCHC: 33.3 g/dL (ref 30.0–36.0)
MCV: 86.6 fL (ref 80.0–100.0)
MCV: 90.4 fL (ref 80.0–100.0)
Platelets: 131 10*3/uL — ABNORMAL LOW (ref 150–400)
Platelets: 151 10*3/uL (ref 150–400)
RBC: 2.62 MIL/uL — ABNORMAL LOW (ref 3.87–5.11)
RBC: 2.82 MIL/uL — ABNORMAL LOW (ref 3.87–5.11)
RDW: 13.4 % (ref 11.5–15.5)
RDW: 13.1 % (ref 11.5–15.5)
WBC: 6.3 10*3/uL (ref 4.0–10.5)
WBC: 7.2 10*3/uL (ref 4.0–10.5)
nRBC: 0 % (ref 0.0–0.2)
nRBC: 0 % (ref 0.0–0.2)

## 2023-02-15 LAB — BASIC METABOLIC PANEL
Anion gap: 6 (ref 5–15)
BUN: 10 mg/dL (ref 8–23)
CO2: 23 mmol/L (ref 22–32)
Calcium: 8.1 mg/dL — ABNORMAL LOW (ref 8.9–10.3)
Chloride: 108 mmol/L (ref 98–111)
Creatinine, Ser: 0.68 mg/dL (ref 0.44–1.00)
GFR, Estimated: >60 mL/min (ref >60)
Glucose, Bld: 183 mg/dL — ABNORMAL HIGH (ref 70–99)
Potassium: 3.7 mmol/L (ref 3.5–5.1)
Sodium: 137 mmol/L (ref 135–145)

## 2023-02-15 MED ORDER — SODIUM CHLORIDE 0.9 % IV SOLN
100.0000 mg | Freq: Once | INTRAVENOUS | Status: DC
  Filled 2023-02-15: qty 5

## 2023-02-15 MED ORDER — SODIUM CHLORIDE 0.9 % IV SOLN
200.0000 mg | Freq: Once | INTRAVENOUS | Status: AC
  Administered 2023-02-15: 200 mg via INTRAVENOUS
  Filled 2023-02-15: qty 200

## 2023-02-15 MED ORDER — POLYETHYLENE GLYCOL 3350 17 GM/SCOOP PO POWD
1.0000 | Freq: Once | ORAL | Status: AC
  Administered 2023-02-15: 255 g via ORAL
  Filled 2023-02-15: qty 255

## 2023-02-15 NOTE — Progress Notes (Signed)
Gabriella Repress, MD [address]  [address]  [place], [place] [address]  Main: [phone]  Fax: [phone] Pager: [phone]   Subjective: Patient had 1 episode of dark maroon, black bowel movement this morning, large amount.  She was taking oral iron for last 2 days. Patient felt nauseous after the bowel movement.  She appears pale when I saw her.  Denies any abdominal pain  Objective: Vital signs in last 24 hours: Vitals:   02/14/23 1942 02/14/23 2100 02/15/23 0348 02/15/23 0839  BP: (!) 130/96 139/63 (!) 127/53 (!) 149/56  Pulse: 75 64 69 64  Resp: 18  19 18   Temp: 98.8 F (37.1 C) 98.4 F (36.9 C) (!) 97.3 F (36.3 C) 98.5 F (36.9 C)  TempSrc: Oral Oral Oral Oral  SpO2: 90% 99% 98% 98%  Weight:      Height:       Weight change:   Intake/Output Summary (Last 24 hours) at 02/15/2023 1407 Last data filed at 02/14/2023 1946 Gross per 24 hour  Intake 0 ml  Output --  Net 0 ml     Exam: Heart: Regular rate and rhythm, S1S2 present, or without murmur or extra heart sounds Lungs: clear to auscultation Abdomen: soft, nontender, normal bowel sounds   Lab Results:    Latest Ref Rng & Units 02/15/2023    1:07 PM 02/15/2023    5:20 AM 02/14/2023   12:53 PM  CBC  WBC 4.0 - 10.5 K/uL 7.2  6.3    Hemoglobin 12.0 - 15.0 g/dL 8.5  8.0  8.4   Hematocrit 36.0 - 46.0 % 25.5  22.7  24.7   Platelets 150 - 400 K/uL 151  131        Latest Ref Rng & Units 02/15/2023    5:20 AM 02/14/2023    4:46 AM 02/13/2023    7:30 AM  CMP  Glucose 70 - 99 mg/dL 093  235  573   BUN 8 - 23 mg/dL 10  17  28    Creatinine 0.44 - 1.00 mg/dL 2.20  2.54  2.70   Sodium 135 - 145 mmol/L 137  139  137   Potassium 3.5 - 5.1 mmol/L 3.7  4.0  3.8   Chloride 98 - 111 mmol/L 108  112  113   CO2 22 - 32 mmol/L 23  24  20    Calcium 8.9 - 10.3 mg/dL 8.1  8.1  7.7   Total Protein 6.5 - 8.1 g/dL   5.0   Total Bilirubin 0.3 - 1.2 mg/dL   0.7   Alkaline Phos 38 - 126 U/L   43   AST 15 - 41  U/L   13   ALT 0 - 44 U/L   12     Micro Results: No results found for this or any previous visit (from the past 240 hour(s)). Studies/Results: No results found. Medications: I have reviewed the patient's current medications. Prior to Admission:  Medications Prior to Admission  Medication Sig Dispense Refill Last Dose   acetaminophen (TYLENOL) 500 MG tablet Take 500-1,000 mg by mouth every 6 (six) hours as needed (for pain.).   Past Week   aspirin EC 81 MG tablet Take 1 tablet (81 mg total) by mouth daily. Swallow whole.   02/12/2023   atorvastatin (LIPITOR) 40 MG tablet Take 1 tablet (40 mg total) by mouth daily. 30 tablet 0 02/12/2023   escitalopram (LEXAPRO) 20 MG tablet Take 20 mg by mouth daily.  02/12/2023   fluticasone (FLONASE) 50 MCG/ACT nasal spray Place 1 spray into both nostrils daily as needed (for allergies).    prn at unk   glimepiride (AMARYL) 4 MG tablet Take 4 mg by mouth daily with breakfast.   02/12/2023   lisinopril (ZESTRIL) 5 MG tablet Take 1 tablet (5 mg total) by mouth daily. 30 tablet 0 02/12/2023   metFORMIN (GLUCOPHAGE) 500 MG tablet Take 500 mg by mouth daily.   02/12/2023   omeprazole (PRILOSEC) 20 MG capsule Take 20 mg by mouth daily.   02/12/2023   clopidogrel (PLAVIX) 75 MG tablet Take 1 tablet (75 mg total) by mouth daily. (Patient not taking: Reported on 02/13/2023) 30 tablet 0 Not Taking   Scheduled:  atorvastatin  40 mg Oral Daily   escitalopram  20 mg Oral Daily   Fe Fum-Vit C-Vit B12-FA  1 capsule Oral QPC breakfast   insulin aspart  0-15 Units Subcutaneous TID WC   pantoprazole  40 mg Oral BID AC   polyethylene glycol powder  1 Container Oral Once   Continuous:   [id number], fluticasone, ondansetron **OR** ondansetron (ZOFRAN) IV Anti-infectives (From admission, onward)    None      Scheduled Meds:  atorvastatin  40 mg Oral Daily   escitalopram  20 mg Oral Daily   Fe Fum-Vit C-Vit B12-FA  1 capsule Oral QPC breakfast   insulin  aspart  0-15 Units Subcutaneous TID WC   pantoprazole  40 mg Oral BID AC   polyethylene glycol powder  1 Container Oral Once   Continuous Infusions:   PRN Meds:.acetaminophen, fluticasone, ondansetron **OR** ondansetron (ZOFRAN) IV   Assessment: Principal Problem:   Rectal bleeding Active Problems:   Anxiety   GERD (gastroesophageal reflux disease)   OSA (obstructive sleep apnea)   Type 2 diabetes mellitus without complication, without long-term current use of insulin (HCC)   CVA (cerebral vascular accident) (HCC)   Essential hypertension   Acute blood loss anemia   Diverticulosis of intestine with bleeding   Lower GI bleed  Gabriella [name] is a 82 y.o. female with history of diabetes, hypertension, stroke with right-sided weakness in 04/2022, status post tPA on aspirin 81 mg pancolonic diverticulosis is admitted with large episode of bright red blood per rectum with acute blood loss anemia.  Plan: Rectal bleeding, acute blood loss anemia Patient had an episode of black bowel movement today, likely passing old blood Most likely diverticular bleed CT angio negative for active extravasation Status post 1 unit of PRBCs, hemoglobin responded appropriately H&H overall stable Aspirin has been held during the admission.  Apparently, per the pharmacy, patient has stopped taking Plavix a while ago per Dr. Shanda Bumps note Given elevated BUN/creatinine on admission, recommend EGD to rule out any peptic ulcer disease along with colonoscopy tomorrow Clear liquid diet today Bowel prep ordered N.p.o. effective 5 AM tomorrow  I have discussed alternative options, risks & benefits,  which include, but are not limited to, bleeding, infection, perforation,respiratory complication & drug reaction.  The patient agrees with this plan & written consent will be obtained.        LOS: 0 days   Debara Kamphuis 02/15/2023, 2:07 PM

## 2023-02-15 NOTE — Progress Notes (Addendum)
Progress Note   Patient: Gabriella [name] XLK:[id number] DOB: [birth year] DOA: 02/13/2023     0 DOS: the patient was seen and examined on 02/15/2023   Brief hospital course: Taken from H&P.  Gabriella [name] is a 82 y.o. 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  Patient had approximately 6 episodes of small to medium amount of bright red blood per rectum.  Some nausea and vomited x 1 en route to ED. History of chronic intermittent diarrhea which she attributed to chronic use of metformin.  No prior history of GI bleed.  On presentation she had borderline bradycardia, BP 150/60.  CTA of abdomen with no active bleeding identified.  Hemoglobin initially 8.2 as compared to the baseline of 11.3 and later decreased to 7.3 so 1 unit of PRBC ordered.  Suspecting diverticular bleed, as imaging shows self-limiting and stopped bleeding for few hours.  Plan was to observe and if remained stable without any more bleeding then outpatient GI follow-up for elective colonoscopy.  10/8: Vital stable.  Hemoglobin at 8.6 s/p 1 unit of PRBC.  Anemia panel with normal iron stores but low ferritin.  Folate and B12 normal, starting on supplement. Patient had another bloody bowel movement, repeat hemoglobin 8.4.  GI is recommending RBC tagged scan if she continues to have bleeding.  Observing for another day.    Assessment and Plan: * GI bleeding Likely diverticular bleed. Last colonoscopy 2019 with diverticulosis, internal hemorrhoids.  Patient had another episode but hemoglobin seems stable, s/p 1 unit of PRBC.  GI is on board CT angio neg  Plavix was listed in her medications but she has stopped taking them.  Aspirin is currently on hold, also takes occasional nsaids -Continue to monitor hemoglobin -Transfuse as needed - for egd/colonoscopy tomorrow  Acute blood loss anemia Anemia  panel essentially normal except low ferritin level. 1 unit thus far, hgb this morning 8 with ongoing bleeding though blood darker than previously. Has received IV iron. -monitor and transfuse as needed  Type 2 diabetes mellitus without complication, without long-term current use of insulin (HCC) Patient was experiencing chronic diarrhea due to metformin-recommend to stop it and discuss with PCP for other p.o. options. -Continue with SSI for now  Essential hypertension Blood pressure within goal. - holding home lisinopril  GERD (gastroesophageal reflux disease) -Continue PPI  Anxiety -Continue home Lexapro   Subjective: Patient was seen and examined today.  No new concern.  Dark bloody stool this morning. Feeling well, no pain or presyncope  Physical Exam: Vitals:   02/14/23 1942 02/14/23 2100 02/15/23 0348 02/15/23 0839  BP: (!) 130/96 139/63 (!) 127/53 (!) 149/56  Pulse: 75 64 69 64  Resp: 18  19 18   Temp: 98.8 F (37.1 C) 98.4 F (36.9 C) (!) 97.3 F (36.3 C) 98.5 F (36.9 C)  TempSrc: Oral Oral Oral Oral  SpO2: 90% 99% 98% 98%  Weight:      Height:       General.  Frail elderly lady, in no acute distress. Pulmonary.  Lungs clear bilaterally, normal respiratory effort. CV.  Regular rate and rhythm, no JVD, rub or murmur. Abdomen.  Soft, nontender, nondistended, BS positive. CNS.  Alert and oriented .  No focal neurologic deficit. Extremities.  No edema, no cyanosis, pulses intact and symmetrical. Psychiatry.  Judgment and insight appears normal.   Data Reviewed: Prior data reviewed  Family Communication: son Gabriella [name] updated telephonically. Patient lives with Gabriella [name]  Disposition: Status is: Observation The patient will require care spanning > 2 midnights and should be moved to inpatient because: Severity of illness  Planned Discharge Destination: Home  Time spent: 35 minutes   Author: Silvano Bilis, MD 02/15/2023 1:01 PM  For on call review [email or web address].

## 2023-02-15 NOTE — TOC CM/SW Note (Signed)
Transition of Care [facility]) - Inpatient Brief Assessment   Patient Details  Name: Gabriella [name] MRN: [id number] Date of Birth: 1940-09-22  Transition of Care [facility]) CM/SW Contact:    Chapman Fitch, RN Phone Number: 02/15/2023, 2:22 PM   Clinical Narrative:   Transition of Care [facility]) Screening Note   Patient Details  Name: [name] Date of Birth: 06/12/1940   Transition of Care [facility]) CM/SW Contact:    Chapman Fitch, RN Phone Number: 02/15/2023, 2:23 PM    Transition of Care Department [facility]) has reviewed patient and no TOC needs have been identified at this time. . If new patient transition needs arise, please place a TOC consult.    Transition of Care Asessment: Insurance and Status: Insurance coverage has been reviewed Patient has primary care physician: Yes     Prior/Current Home Services: No current home services Social Determinants of Health Reivew: SDOH reviewed no interventions necessary Readmission risk has been reviewed: Yes Transition of care needs: no transition of care needs at this time

## 2023-02-15 NOTE — Progress Notes (Signed)
Mobility Specialist - Progress Note   02/15/23 0900  Mobility  Activity Ambulated with assistance in room;Ambulated with assistance to bathroom;Transferred from bed to chair;Transferred from chair to bed  Level of Assistance Standby assist, set-up cues, supervision of patient - no hands on  Assistive Device Cane  Distance Ambulated (ft) 30 ft  Activity Response Tolerated well  $Mobility charge 1 Mobility     Mobility responded to bed alarm. Pt sitting EOB upon arrival, requesting assistance to restroom. Noted soiled sheets upon standing. Pt ambulated to bathroom with minG + 4PC. No LOB. Ambulated to chair for bathing tasks while bed linen being changed; NT in to assist. Pt returned supine with alarm set, needs in reach.    Filiberto Pinks Mobility Specialist 02/15/23, 9:58 AM

## 2023-02-15 NOTE — Plan of Care (Signed)

## 2023-02-15 NOTE — Care Management Obs Status (Signed)
MEDICARE OBSERVATION STATUS NOTIFICATION   Patient Details  Name: Gabriella [name] MRN: [id number] Date of Birth: 09-Nov-1940   Medicare Observation Status Notification Given:  Yes    Chapman Fitch, RN 02/15/2023, 2:21 PM

## 2023-02-15 NOTE — Inpatient Diabetes Management (Signed)
Inpatient Diabetes Program Recommendations  AACE/ADA: New Consensus Statement on Inpatient Glycemic Control   Target Ranges:  Prepandial:   less than 140 mg/dL      Peak postprandial:   less than 180 mg/dL (1-2 hours)      Critically ill patients:  140 - 180 mg/dL    Latest Reference Range & Units 02/14/23 07:31 02/14/23 09:20 02/14/23 11:13 02/14/23 16:39 02/14/23 21:12 02/15/23 08:39 02/15/23 11:33  Glucose-Capillary 70 - 99 mg/dL 914 (H) 782 (H) 956 (H) 153 (H) 166 (H) 190 (H) 226 (H)   Review of Glycemic Control  Diabetes history: DM2 Outpatient Diabetes medications: Amaryl 4 mg QAM, Metformin 500 mg daily Current orders for Inpatient glycemic control: Novolog 0-15 units TID with meals  Inpatient Diabetes Program Recommendations:    Insulin: Please consider ordering Novolog 2 units TID with meals for meal coverage if patient eats at least 50% of meals.   Thanks, Orlando Penner, RN, MSN, CDCES Diabetes Coordinator Inpatient Diabetes Program [phone] (Team Pager from 8am to 5pm)

## 2023-02-16 ENCOUNTER — Observation Stay: Payer: Medicare HMO | Admitting: Anesthesiology

## 2023-02-16 ENCOUNTER — Encounter
Admission: EM | Disposition: A | Discharge status: Home / Self Care | Payer: Self-pay | Source: Home / Self Care | Attending: Emergency Medicine

## 2023-02-16 ENCOUNTER — Encounter: Payer: Self-pay | Admitting: Internal Medicine

## 2023-02-16 DIAGNOSIS — K449 Diaphragmatic hernia without obstruction or gangrene: Secondary | ICD-10-CM | POA: Diagnosis not present

## 2023-02-16 DIAGNOSIS — K635 Polyp of colon: Secondary | ICD-10-CM | POA: Diagnosis not present

## 2023-02-16 DIAGNOSIS — K5790 Diverticulosis of intestine, part unspecified, without perforation or abscess without bleeding: Secondary | ICD-10-CM | POA: Diagnosis not present

## 2023-02-16 DIAGNOSIS — K625 Hemorrhage of anus and rectum: Secondary | ICD-10-CM | POA: Diagnosis not present

## 2023-02-16 DIAGNOSIS — K254 Chronic or unspecified gastric ulcer with hemorrhage: Secondary | ICD-10-CM | POA: Diagnosis not present

## 2023-02-16 DIAGNOSIS — D128 Benign neoplasm of rectum: Secondary | ICD-10-CM | POA: Diagnosis not present

## 2023-02-16 DIAGNOSIS — I10 Essential (primary) hypertension: Secondary | ICD-10-CM | POA: Diagnosis not present

## 2023-02-16 HISTORY — PX: ESOPHAGOGASTRODUODENOSCOPY (EGD) WITH PROPOFOL: SHX5813

## 2023-02-16 HISTORY — PX: COLONOSCOPY WITH PROPOFOL: SHX5780

## 2023-02-16 HISTORY — PX: POLYPECTOMY: SHX5525

## 2023-02-16 LAB — BASIC METABOLIC PANEL
Anion gap: 8 (ref 5–15)
BUN: 5 mg/dL — ABNORMAL LOW (ref 8–23)
CO2: 24 mmol/L (ref 22–32)
Calcium: 8.2 mg/dL — ABNORMAL LOW (ref 8.9–10.3)
Chloride: 105 mmol/L (ref 98–111)
Creatinine, Ser: 0.7 mg/dL (ref 0.44–1.00)
GFR, Estimated: >60 mL/min (ref >60)
Glucose, Bld: 200 mg/dL — ABNORMAL HIGH (ref 70–99)
Potassium: 3.7 mmol/L (ref 3.5–5.1)
Sodium: 137 mmol/L (ref 135–145)

## 2023-02-16 LAB — GLUCOSE, CAPILLARY
Glucose-Capillary: 205 mg/dL — ABNORMAL HIGH (ref 70–99)
Glucose-Capillary: 139 mg/dL — ABNORMAL HIGH (ref 70–99)
Glucose-Capillary: 102 mg/dL — ABNORMAL HIGH (ref 70–99)
Glucose-Capillary: 110 mg/dL — ABNORMAL HIGH (ref 70–99)

## 2023-02-16 LAB — CBC
HCT: 23.6 % — ABNORMAL LOW (ref 36.0–46.0)
Hemoglobin: 8.2 g/dL — ABNORMAL LOW (ref 12.0–15.0)
MCH: 30.4 pg (ref 26.0–34.0)
MCHC: 34.7 g/dL (ref 30.0–36.0)
MCV: 87.4 fL (ref 80.0–100.0)
Platelets: 155 10*3/uL (ref 150–400)
RBC: 2.7 MIL/uL — ABNORMAL LOW (ref 3.87–5.11)
RDW: 13.2 % (ref 11.5–15.5)
WBC: 7.9 10*3/uL (ref 4.0–10.5)
nRBC: 0 % (ref 0.0–0.2)

## 2023-02-16 SURGERY — ESOPHAGOGASTRODUODENOSCOPY (EGD) WITH PROPOFOL
Anesthesia: General

## 2023-02-16 MED ORDER — SODIUM CHLORIDE 0.9 % IV SOLN
INTRAVENOUS | Status: DC | PRN
Start: 2023-02-16 — End: 2023-02-16

## 2023-02-16 MED ORDER — LIDOCAINE HCL (CARDIAC) PF 100 MG/5ML IV SOSY
PREFILLED_SYRINGE | INTRAVENOUS | Status: DC | PRN
  Administered 2023-02-16: 50 mg via INTRAVENOUS

## 2023-02-16 MED ORDER — FERROUS SULFATE 325 (65 FE) MG PO TBEC
325.0000 mg | DELAYED_RELEASE_TABLET | ORAL | 3 refills | Status: AC
Start: 2023-02-16 — End: 2024-06-10

## 2023-02-16 MED ORDER — DEXMEDETOMIDINE HCL IN NACL 80 MCG/20ML IV SOLN
INTRAVENOUS | Status: DC | PRN
Start: 2023-02-16 — End: 2023-02-16
  Administered 2023-02-16: 16 ug via INTRAVENOUS

## 2023-02-16 MED ORDER — PANTOPRAZOLE SODIUM 40 MG PO TBEC: 40.0000 mg | DELAYED_RELEASE_TABLET | Freq: Two times a day (BID) | ORAL | 1 refills | Status: AC

## 2023-02-16 MED ORDER — PROPOFOL 10 MG/ML IV BOLUS
INTRAVENOUS | Status: DC | PRN
  Administered 2023-02-16: 50 mg via INTRAVENOUS

## 2023-02-16 MED ORDER — PROPOFOL 500 MG/50ML IV EMUL
INTRAVENOUS | Status: DC | PRN
  Administered 2023-02-16: 100 ug/kg/min via INTRAVENOUS

## 2023-02-16 NOTE — Op Note (Signed)
[facility] Gastroenterology Patient Name: Gabriella [name] Procedure Date: 02/16/2023 1:51 PM MRN: [id number] Account #: [id number] Date of Birth: 12-05-1940 Admit Type: Inpatient Age: 82 Room: [facility] ENDO ROOM 3 Gender: Female Note Status: Finalized Instrument Name: Upper Endoscope [id number] Procedure:             Upper GI endoscopy Indications:           Active gastrointestinal bleeding, Gastrointestinal                         bleeding of unknown origin Providers:             Toney Reil MD, MD Referring MD:          Barbette Reichmann, MD (Referring MD) Medicines:             General Anesthesia Complications:         No immediate complications. Estimated blood loss: None. Procedure:             Pre-Anesthesia Assessment:                        - Prior to the procedure, a History and Physical was                         performed, and patient medications and allergies were                         reviewed. The patient is competent. The risks and                         benefits of the procedure and the sedation options and                         risks were discussed with the patient. All questions                         were answered and informed consent was obtained.                         Patient identification and proposed procedure were                         verified by the physician, the nurse, the                         anesthesiologist, the anesthetist and the technician                         in the pre-procedure area in the procedure room in the                         endoscopy suite. Mental Status Examination: alert and                         oriented. Airway Examination: normal oropharyngeal                         airway and neck mobility. Respiratory Examination:  clear to auscultation. CV Examination: normal.                         Prophylactic Antibiotics: The patient does not require                          prophylactic antibiotics. Prior Anticoagulants: The                         patient has taken no anticoagulant or antiplatelet                         agents. ASA Grade Assessment: III - A patient with                         severe systemic disease. After reviewing the risks and                         benefits, the patient was deemed in satisfactory                         condition to undergo the procedure. The anesthesia                         plan was to use general anesthesia. Immediately prior                         to administration of medications, the patient was                         re-assessed for adequacy to receive sedatives. The                         heart rate, respiratory rate, oxygen saturations,                         blood pressure, adequacy of pulmonary ventilation, and                         response to care were monitored throughout the                         procedure. The physical status of the patient was                         re-assessed after the procedure.                        After obtaining informed consent, the endoscope was                         passed under direct vision. Throughout the procedure,                         the patient's blood pressure, pulse, and oxygen                         saturations were monitored continuously. The Endoscope  was introduced through the mouth, and advanced to the                         second part of duodenum. The upper GI endoscopy was                         accomplished without difficulty. The patient tolerated                         the procedure well. Findings:      The duodenal bulb and second portion of the duodenum were normal.      A 6 cm hiatal hernia with a few Cameron erosions was found. The proximal       extent of the gastric folds (end of tubular esophagus) was 40 cm from       the incisors. The hiatal narrowing was 34 cm from the incisors. The       Z-line was 40  cm from the incisors.      The gastroesophageal junction and examined esophagus were normal.      The gastric fundus, gastric body, incisura and gastric antrum were       normal. Impression:            - Normal duodenal bulb and second portion of the                         duodenum.                        - 6 cm hiatal hernia with a few Cameron erosions.                        - Normal gastroesophageal junction and esophagus.                        - Normal gastric fundus, gastric body, incisura and                         antrum.                        - No specimens collected. Recommendation:        - Follow an antireflux regimen for the rest of the                         patient's life.                        - Use a proton pump inhibitor PO BID for the rest of                         the patient's life.                        - Proceed with colonoscopy as scheduled                        See colonoscopy report Procedure Code(s):     --- Professional ---  16109, Esophagogastroduodenoscopy, flexible,                         transoral; diagnostic, including collection of                         specimen(s) by brushing or washing, when performed                         (separate procedure) Diagnosis Code(s):     --- Professional ---                        K44.9, Diaphragmatic hernia without obstruction or                         gangrene                        K25.9, Gastric ulcer, unspecified as acute or chronic,                         without hemorrhage or perforation                        K92.2, Gastrointestinal hemorrhage, unspecified CPT copyright 2022 American Medical Association. All rights reserved. The codes documented in this report are preliminary and upon coder review may  be revised to meet current compliance requirements. Dr. Libby Maw Toney Reil MD, MD 02/16/2023 2:09:12 PM This report has been signed electronically. Number of  Addenda: 0 Note Initiated On: 02/16/2023 1:51 PM Estimated Blood Loss:  Estimated blood loss: none.      [facility]

## 2023-02-16 NOTE — Progress Notes (Signed)
EGD and colonoscopy postprocedure note  EGD showed large hiatal hernia with Gabriella [name] erosions, recommend long-term high-dose PPI Colonoscopy showed diverticulosis only source of acute blood loss anemia, rectal bleeding, small polyps in the rectum removed, no active bleeding She can go home on iron and clinic follow-up with Gabriella [name] who is her primary GI   RV

## 2023-02-16 NOTE — Transfer of Care (Signed)
Immediate Anesthesia Transfer of Care Note  Patient: Gabriella [name]  Procedure(s) Performed: ESOPHAGOGASTRODUODENOSCOPY (EGD) WITH PROPOFOL COLONOSCOPY WITH PROPOFOL POLYPECTOMY  Patient Location: PACU  Anesthesia Type:General  Level of Consciousness: sedated and responds to stimulation  Airway & Oxygen Therapy: Patient Spontanous Breathing  Post-op Assessment: Report given to RN and Post -op Vital signs reviewed and stable  Post vital signs: Reviewed and stable  Last Vitals:  Vitals Value Taken Time  BP 99/44 02/16/23 1431  Temp    Pulse 85 02/16/23 1431  Resp 18 02/16/23 1431  SpO2 98 % 02/16/23 1431  Vitals shown include unfiled device data.  Last Pain:  Vitals:   02/16/23 1325  TempSrc: Temporal  PainSc:          Complications: No notable events documented.

## 2023-02-16 NOTE — Anesthesia Preprocedure Evaluation (Signed)
Anesthesia Evaluation  Patient identified by MRN, date of birth, ID band Patient awake    Reviewed: Allergy & Precautions, NPO status , Patient's Chart, lab work & pertinent test results  History of Anesthesia Complications Negative for: history of anesthetic complications  Airway Mallampati: III  TM Distance: >3 FB Neck ROM: Full    Dental  (+) Dental Advidsory Given, Poor Dentition   Pulmonary neg shortness of breath, sleep apnea (has CPAP but does not wear it) , neg COPD, neg recent URI   breath sounds clear to auscultation- rhonchi (-) wheezing      Cardiovascular Exercise Tolerance: Good (-) hypertension(-) angina (-) CAD, (-) Past MI, (-) Cardiac Stents and (-) CABG (-) dysrhythmias (-) Valvular Problems/Murmurs Rhythm:Regular Rate:Normal - Systolic murmurs and - Diastolic murmurs    Neuro/Psych neg Seizures  Anxiety     CVA, No Residual Symptoms    GI/Hepatic Neg liver ROS,GERD  ,,  Endo/Other  diabetes, Oral Hypoglycemic Agents    Renal/GU negative Renal ROS     Musculoskeletal  (+) Arthritis ,    Abdominal  (+) - obese  Peds  Hematology  (+) Blood dyscrasia, anemia   Anesthesia Other Findings Past Medical History: No date: Allergic rhinitis No date: Anxiety No date: Arthritis 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, left No date: Diabetes mellitus without complication (HCC) No date: GERD (gastroesophageal reflux disease) No date: History of kidney stones No date: Sleep apnea     Comment:  OSA---HAS C-PAP BUT   DOES NOT USE   Reproductive/Obstetrics                             Anesthesia Physical Anesthesia Plan  ASA: 3  Anesthesia Plan: General   Post-op Pain Management:    Induction: Intravenous  PONV Risk Score and Plan: 2 and Propofol infusion and TIVA  Airway Management Planned: Natural Airway and Nasal Cannula  Additional Equipment:   Intra-op Plan:   Post-operative Plan:    Informed Consent: I have reviewed the patients History and Physical, chart, labs and discussed the procedure including the risks, benefits and alternatives for the proposed anesthesia with the patient or authorized representative who has indicated his/her understanding and acceptance.     Dental advisory given  Plan Discussed with: CRNA and Anesthesiologist  Anesthesia Plan Comments:         Anesthesia Quick Evaluation

## 2023-02-16 NOTE — Op Note (Signed)
[facility] Gastroenterology Patient Name: Gabriella [name] Procedure Date: 02/16/2023 1:50 PM MRN: [id number] Account #: [id number] Date of Birth: 1940-07-09 Admit Type: Inpatient Age: 82 Room: [facility] ENDO ROOM 3 Gender: Female Note Status: Finalized Instrument Name: Peds Colonoscope [id number] Procedure:             Colonoscopy Indications:           Last colonoscopy: January 2019, Hematochezia, Acute                         post hemorrhagic anemia Providers:             Toney Reil MD, MD Referring MD:          Barbette Reichmann, MD (Referring MD) Medicines:             General Anesthesia Complications:         No immediate complications. Estimated blood loss: None. Procedure:             Pre-Anesthesia Assessment:                        - Prior to the procedure, a History and Physical was                         performed, and patient medications and allergies were                         reviewed. The patient is competent. The risks and                         benefits of the procedure and the sedation options and                         risks were discussed with the patient. All questions                         were answered and informed consent was obtained.                         Patient identification and proposed procedure were                         verified by the physician, the nurse, the                         anesthesiologist, the anesthetist and the technician                         in the pre-procedure area in the procedure room in the                         endoscopy suite. Mental Status Examination: alert and                         oriented. Airway Examination: normal oropharyngeal                         airway and neck mobility. Respiratory Examination:  clear to auscultation. CV Examination: normal.                         Prophylactic Antibiotics: The patient does not require                         prophylactic  antibiotics. Prior Anticoagulants: The                         patient has taken no anticoagulant or antiplatelet                         agents. ASA Grade Assessment: III - A patient with                         severe systemic disease. After reviewing the risks and                         benefits, the patient was deemed in satisfactory                         condition to undergo the procedure. The anesthesia                         plan was to use general anesthesia. Immediately prior                         to administration of medications, the patient was                         re-assessed for adequacy to receive sedatives. The                         heart rate, respiratory rate, oxygen saturations,                         blood pressure, adequacy of pulmonary ventilation, and                         response to care were monitored throughout the                         procedure. The physical status of the patient was                         re-assessed after the procedure.                        After obtaining informed consent, the colonoscope was                         passed under direct vision. Throughout the procedure,                         the patient's blood pressure, pulse, and oxygen                         saturations were monitored continuously. The  Colonoscope was introduced through the anus and                         advanced to the 5 cm into the ileum. The colonoscopy                         was performed with moderate difficulty due to                         significant looping and the patient's body habitus.                         Successful completion of the procedure was aided by                         applying abdominal pressure. The patient tolerated the                         procedure well. The quality of the bowel preparation                         was fair. The terminal ileum, ileocecal valve,                          appendiceal orifice, and rectum were photographed. Findings:      The perianal and digital rectal examinations were normal. Pertinent       negatives include normal sphincter tone and no palpable rectal lesions.      The terminal ileum appeared normal.      A 5 mm polyp was found in the proximal rectum. The polyp was sessile.       The polyp was removed with a cold snare. Resection and retrieval were       complete. Estimated blood loss: none.      A 12 mm polyp was found in the rectum. The polyp was pedunculated. The       polyp was removed with a hot snare. Resection and retrieval were       complete. Estimated blood loss: none.      A 9 mm polyp was found in the distal rectum. The polyp was sessile. The       polyp was removed with a hot snare. Resection and retrieval were       complete.      Multiple large-mouthed diverticula were found in the sigmoid colon,       descending colon, transverse colon and ascending colon.      Non-bleeding external hemorrhoids were found during retroflexion. The       hemorrhoids were medium-sized. Impression:            - Preparation of the colon was fair.                        - The examined portion of the ileum was normal.                        - One 5 mm polyp in the proximal rectum, removed with                         a cold  snare. Resected and retrieved.                        - One 12 mm polyp in the rectum, removed with a hot                         snare. Resected and retrieved.                        - One 9 mm polyp in the distal rectum, removed with a                         hot snare. Resected and retrieved.                        - Diverticulosis in the sigmoid colon, in the                         descending colon, in the transverse colon and in the                         ascending colon.                        - Non-bleeding external hemorrhoids. Recommendation:        - Return patient to hospital ward for possible                          discharge same day.                        - Cardiac diet today.                        - Continue present medications.                        - Await pathology results. Procedure Code(s):     --- Professional ---                        [phone], Colonoscopy, flexible; with removal of                         tumor(s), polyp(s), or other lesion(s) by snare                         technique Diagnosis Code(s):     --- Professional ---                        K64.4, Residual hemorrhoidal skin tags                        D12.8, Benign neoplasm of rectum                        K92.1, Melena (includes Hematochezia)                        D62, Acute posthemorrhagic anemia  K57.30, Diverticulosis of large intestine without                         perforation or abscess without bleeding CPT copyright 2022 American Medical Association. All rights reserved. The codes documented in this report are preliminary and upon coder review may  be revised to meet current compliance requirements. Dr. Libby Maw Toney Reil MD, MD 02/16/2023 2:30:01 PM This report has been signed electronically. Number of Addenda: 0 Note Initiated On: 02/16/2023 1:50 PM Scope Withdrawal Time: 0 hours 12 minutes 20 seconds  Total Procedure Duration: 0 hours 16 minutes 22 seconds  Estimated Blood Loss:  Estimated blood loss: none.      [facility]

## 2023-02-16 NOTE — Discharge Summary (Signed)
Gabriella [name] MVH:[id number] DOB: Dec 30, 1940 DOA: 02/13/2023  PCP: Barbette Reichmann, MD  Admit date: 02/13/2023 Discharge date: 02/16/2023  Time spent: 35 minutes  Recommendations for Outpatient Follow-up:  Pcp f/u and gi f/u Consider cbc at f/u     Discharge Diagnoses:  Principal Problem:   Rectal bleeding Active Problems:   Acute blood loss anemia   Type 2 diabetes mellitus without complication, without long-term current use of insulin (HCC)   Essential hypertension   GERD (gastroesophageal reflux disease)   Anxiety   OSA (obstructive sleep apnea)   CVA (cerebral vascular accident) (HCC)   Diverticulosis of intestine with bleeding   Lower GI bleed   Discharge Condition: improved  Diet recommendation: heart healthy  Filed Weights   02/13/23 0650  Weight: 78.4 kg    History of present illness:  From admission h and p Gabriella [name] is a 82 y.o. 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   Patient woke up last night started to pass bright blood per rectum, small to medium amount x 6 episodes, denied any abdominal pain but did feel nauseous and vomited x 1 en route to ED.  No chest pain shortness of breath no lightheadedness or blurry vision.  Last episode of rectal bleeding was 6 AM.  Patient reported that she has no history of GI bleeding and she reported that she has a chronic on and off diarrhea which she attributed to chronic use of metformin.    Hospital Course:  Patient presents with GI bleeding. HGB baseline 11s here found ot be 7s. Received 1 unit prbc and hgb stable after that with cessation of bleeding. EGD shows large hiatal hernia and non-bleeding cameron ulcer, the likely source of her bleed. Colonoscopy with polyps that were resected. GI advises pantoprazole 40 oral bid indefinitely, also will d/c on oral iron. Will need GI f/u in  addition to PCP f/u and patient is aware no nsaids other than asa (which she needs for her history of CVA). BPs here soft, have advised holding home lisinopril until pcp f/u. Patient clarifies she is no longer on plavix.   Procedures: EGD, colonoscopy   Consultations: GI  Discharge Exam: Vitals:   02/16/23 1513 02/16/23 1535  BP: (!) 110/50 (!) 113/55  Pulse: 65 (!) 58  Resp: 12 18  Temp:  98.6 F (37 C)  SpO2: 100% 100%    General: NAD Cardiovascular: RRR Respiratory: CTAB  Discharge Instructions   Discharge Instructions     Diet - low sodium heart healthy   Complete by: As directed    Increase activity slowly   Complete by: As directed       Allergies as of 02/16/2023   No Known Allergies      Medication List     STOP taking these medications    clopidogrel 75 MG tablet Commonly known as: PLAVIX   lisinopril 5 MG tablet Commonly known as: ZESTRIL   omeprazole 20 MG capsule Commonly known as: PRILOSEC Replaced by: pantoprazole 40 MG tablet       TAKE these medications    acetaminophen 500 MG tablet Commonly known as: TYLENOL Take 500-1,000 mg by mouth every 6 (six) hours as needed (for pain.).   aspirin EC 81 MG tablet Take 1 tablet (81 mg total) by mouth daily. Swallow whole.   atorvastatin 40 MG tablet Commonly known as: LIPITOR Take  1 tablet (40 mg total) by mouth daily.   escitalopram 20 MG tablet Commonly known as: LEXAPRO Take 20 mg by mouth daily.   ferrous sulfate 325 (65 FE) MG EC tablet Take 1 tablet (325 mg total) by mouth every other day.   fluticasone 50 MCG/ACT nasal spray Commonly known as: FLONASE Place 1 spray into both nostrils daily as needed (for allergies).   glimepiride 4 MG tablet Commonly known as: AMARYL Take 4 mg by mouth daily with breakfast.   metFORMIN 500 MG tablet Commonly known as: GLUCOPHAGE Take 500 mg by mouth daily.   pantoprazole 40 MG tablet Commonly known as: PROTONIX Take 1 tablet (40  mg total) by mouth 2 (two) times daily before a meal. Replaces: omeprazole 20 MG capsule       No Known Allergies  Follow-up Information     Barbette Reichmann, MD Follow up.   Specialty: Internal Medicine Contact information: [address] [place] [address] [phone]         Stanton Kidney, MD Follow up.   Specialty: Gastroenterology Why: call to schedule follow up Contact information: [address] [place] [address] [phone]                  The results of significant diagnostics from this hospitalization (including imaging, microbiology, ancillary and laboratory) are listed below for reference.    Significant Diagnostic Studies: CT ANGIO GI BLEED  Result Date: 02/13/2023 CLINICAL DATA:  Active GI bleed, likely diverticular. Bleeding per rectum. EXAM: CTA ABDOMEN AND PELVIS WITHOUT AND WITH CONTRAST TECHNIQUE: Multidetector CT imaging of the abdomen and pelvis was performed using the standard protocol during bolus administration of intravenous contrast. Multiplanar reconstructed images and MIPs were obtained and reviewed to evaluate the vascular anatomy. RADIATION DOSE REDUCTION: This exam was performed according to the departmental dose-optimization program which includes automated exposure control, adjustment of the mA and/or kV according to patient size and/or use of iterative reconstruction technique. CONTRAST:  OMNIPAQUE IOHEXOL 350 MG/ML SOLN COMPARISON:  None Available. FINDINGS: VASCULAR Aorta: Normal caliber aorta without aneurysm, dissection, vasculitis or significant stenosis. There are 2 moderate calcified and noncalcified plaque throughout the aorta and its major branches. Celiac: Patent without evidence of aneurysm, dissection, vasculitis or significant stenosis. SMA: Patent without evidence of aneurysm, dissection, vasculitis or significant stenosis. Renals: Both renal arteries are patent without  evidence of aneurysm, dissection, vasculitis, fibromuscular dysplasia or significant stenosis. IMA: Patent without evidence of aneurysm, dissection, vasculitis or significant stenosis. Inflow: Patent without evidence of aneurysm, dissection, vasculitis or significant stenosis. Proximal Outflow: Bilateral common femoral and visualized portions of the superficial and profunda femoral arteries are patent without evidence of aneurysm, dissection, vasculitis or significant stenosis. Veins: No obvious venous abnormality within the limitations of this arterial phase study. Review of the MIP images confirms the above findings. NON-VASCULAR Lower chest: There are subpleural atelectatic changes in the visualized lung bases. No overt consolidation. No pleural effusion. The heart is normal in size. No pericardial effusion. Hepatobiliary: The liver is normal in size. Non-cirrhotic configuration. No suspicious mass. No intrahepatic or extrahepatic bile duct dilation. There is a single predominantly calcified gallstone in the fundal region without imaging signs of acute cholecystitis. Normal gallbladder wall thickness. No pericholecystic inflammatory changes. Pancreas: Unremarkable. No pancreatic ductal dilatation or surrounding inflammatory changes. Spleen: Within normal limits. No focal lesion. Adrenals/Urinary Tract: There are indeterminate 11 x 17 mm left adrenal and 8 x 11 mm right  adrenal nodules. These can not be characterized as adenoma on the basis of the exam. These may represent lipid poor adenomas however, indeterminate on the basis of this exam. No suspicious renal mass. No hydronephrosis. There is a 9 x 15 mm left nonobstructing calculus in the left kidney lower pole calyx. No other nephroureterolithiasis on either side. Bilateral extrarenal pelvis noted. Unremarkable urinary bladder. Stomach/Bowel: Moderate sized hiatal hernia noted. There is a diverticulum arising from the second/third part of duodenum. No  disproportionate dilation of the small or large bowel loops. No evidence of abnormal bowel wall thickening or inflammatory changes. The appendix is unremarkable. There are multiple diverticula throughout the colon with asymmetric more involvement of the sigmoid colon. No evidence of active extravasation of contrast noted. Vascular/Lymphatic: No ascites or pneumoperitoneum. No abdominal or pelvic lymphadenopathy, by size criteria. No aneurysmal dilation of the major abdominal arteries. There are moderate peripheral atherosclerotic vascular calcifications of the aorta and its major branches. Reproductive: The uterus is unremarkable. No large adnexal mass. Other: There is a tiny fat containing umbilical hernia. The soft tissues and abdominal wall are otherwise unremarkable. Musculoskeletal: No suspicious osseous lesions. There are mild - moderate multilevel degenerative changes in the visualized spine. IMPRESSION: 1. No evidence of active extravasation of contrast to suggest the presence of an active GI bleed. 2. Extensive colonic diverticulosis without superimposed acute inflammatory change. 3. Nonobstructing left renal calculus. 4. Indeterminate bilateral adrenal nodules. These may represent lipid poor adenomas however, indeterminate on the basis of this exam. If indicated, this could be confirmed with dedicated adrenal protocol CT or MRI examination. 5. Multiple other nonacute observations, as described above. Aortic Atherosclerosis (ICD10-I70.0). Electronically Signed   By: Jules Schick M.D.   On: 02/13/2023 08:50    Microbiology: No results found for this or any previous visit (from the past 240 hour(s)).   Labs: Basic Metabolic Panel: Recent Labs  Lab 02/13/23 0730 02/14/23 0446 02/15/23 0520 02/16/23 0546  NA 137 139 137 137  K 3.8 4.0 3.7 3.7  CL 113* 112* 108 105  CO2 20* 24 23 24   GLUCOSE 228* 161* 183* 200*  BUN 28* 17 10 5*  CREATININE 0.86 0.71 0.68 0.70  CALCIUM 7.7* 8.1* 8.1* 8.2*    Liver Function Tests: Recent Labs  Lab 02/13/23 0730  AST 13*  ALT 12  ALKPHOS 43  BILITOT 0.7  PROT 5.0*  ALBUMIN 2.8*   No results for input(s): "LIPASE", "AMYLASE" in the last 168 hours. No results for input(s): "AMMONIA" in the last 168 hours. CBC: Recent Labs  Lab 02/13/23 0730 02/13/23 1255 02/14/23 0446 02/14/23 1253 02/15/23 0520 02/15/23 1307 02/16/23 0031  WBC 8.5  --  7.8  --  6.3 7.2 7.9  NEUTROABS 6.6  --   --   --   --   --   --   HGB 8.2*   < > 8.6* 8.4* 8.0* 8.5* 8.2*  HCT 24.7*   < > 25.3* 24.7* 22.7* 25.5* 23.6*  MCV 91.1  --  89.4  --  86.6 90.4 87.4  PLT 165  --  166  --  131* 151 155   < > = values in this interval not displayed.   Cardiac Enzymes: No results for input(s): "CKTOTAL", "CKMB", "CKMBINDEX", "TROPONINI" in the last 168 hours. BNP: BNP (last 3 results) No results for input(s): "BNP" in the last 8760 hours.  ProBNP (last 3 results) No results for input(s): "PROBNP" in the last 8760 hours.  CBG: Recent  Labs  Lab 02/15/23 1639 02/15/23 2125 02/16/23 0836 02/16/23 1146 02/16/23 1336  GLUCAP 247* 239* 205* 139* 102*       Signed:  Silvano Bilis MD.  [facility] 02/16/2023, 4:17 PM

## 2023-02-17 ENCOUNTER — Encounter: Payer: Self-pay | Admitting: Gastroenterology

## 2023-02-17 LAB — SURGICAL PATHOLOGY

## 2023-02-18 NOTE — Anesthesia Postprocedure Evaluation (Signed)
Anesthesia Post Note  Patient: ANIELA CANIGLIA  Procedure(s) Performed: ESOPHAGOGASTRODUODENOSCOPY (EGD) WITH PROPOFOL COLONOSCOPY WITH PROPOFOL POLYPECTOMY  Patient location during evaluation: Endoscopy Anesthesia Type: General Level of consciousness: awake and alert Pain management: pain level controlled Vital Signs Assessment: post-procedure vital signs reviewed and stable Respiratory status: spontaneous breathing, nonlabored ventilation, respiratory function stable and patient connected to nasal cannula oxygen Cardiovascular status: blood pressure returned to baseline and stable Postop Assessment: no apparent nausea or vomiting Anesthetic complications: no   No notable events documented.   Last Vitals:  Vitals:   02/16/23 1513 02/16/23 1535  BP: (!) 110/50 (!) 113/55  Pulse: 65 (!) 58  Resp: 12 18  Temp:  37 C  SpO2: 100% 100%    Last Pain:  Vitals:   02/16/23 1535  TempSrc: Oral  PainSc:                  Lenard Simmer

## 2023-02-20 DIAGNOSIS — Z09 Encounter for follow-up examination after completed treatment for conditions other than malignant neoplasm: Secondary | ICD-10-CM | POA: Diagnosis not present

## 2023-02-20 DIAGNOSIS — I10 Essential (primary) hypertension: Secondary | ICD-10-CM | POA: Diagnosis not present

## 2023-02-20 DIAGNOSIS — K922 Gastrointestinal hemorrhage, unspecified: Secondary | ICD-10-CM | POA: Diagnosis not present

## 2023-02-20 DIAGNOSIS — Z8673 Personal history of transient ischemic attack (TIA), and cerebral infarction without residual deficits: Secondary | ICD-10-CM | POA: Diagnosis not present

## 2023-03-20 DIAGNOSIS — G459 Transient cerebral ischemic attack, unspecified: Secondary | ICD-10-CM | POA: Diagnosis not present

## 2023-03-20 DIAGNOSIS — F3341 Major depressive disorder, recurrent, in partial remission: Secondary | ICD-10-CM | POA: Diagnosis not present

## 2023-03-20 DIAGNOSIS — E1165 Type 2 diabetes mellitus with hyperglycemia: Secondary | ICD-10-CM | POA: Diagnosis not present

## 2023-03-20 DIAGNOSIS — D649 Anemia, unspecified: Secondary | ICD-10-CM | POA: Diagnosis not present

## 2023-03-20 DIAGNOSIS — J302 Other seasonal allergic rhinitis: Secondary | ICD-10-CM | POA: Diagnosis not present

## 2023-03-20 DIAGNOSIS — K219 Gastro-esophageal reflux disease without esophagitis: Secondary | ICD-10-CM | POA: Diagnosis not present

## 2023-03-20 DIAGNOSIS — G4733 Obstructive sleep apnea (adult) (pediatric): Secondary | ICD-10-CM | POA: Diagnosis not present

## 2023-03-21 DIAGNOSIS — R8290 Unspecified abnormal findings in urine: Secondary | ICD-10-CM | POA: Diagnosis not present

## 2023-03-27 DIAGNOSIS — Z8719 Personal history of other diseases of the digestive system: Secondary | ICD-10-CM | POA: Diagnosis not present

## 2023-03-27 DIAGNOSIS — K5790 Diverticulosis of intestine, part unspecified, without perforation or abscess without bleeding: Secondary | ICD-10-CM | POA: Diagnosis not present

## 2023-03-27 DIAGNOSIS — I69351 Hemiplegia and hemiparesis following cerebral infarction affecting right dominant side: Secondary | ICD-10-CM | POA: Diagnosis not present

## 2023-03-27 DIAGNOSIS — D649 Anemia, unspecified: Secondary | ICD-10-CM | POA: Diagnosis not present

## 2023-03-27 DIAGNOSIS — N390 Urinary tract infection, site not specified: Secondary | ICD-10-CM | POA: Diagnosis not present

## 2023-03-27 DIAGNOSIS — F32A Depression, unspecified: Secondary | ICD-10-CM | POA: Diagnosis not present

## 2023-03-27 DIAGNOSIS — E119 Type 2 diabetes mellitus without complications: Secondary | ICD-10-CM | POA: Diagnosis not present

## 2023-07-13 DIAGNOSIS — F325 Major depressive disorder, single episode, in full remission: Secondary | ICD-10-CM | POA: Diagnosis not present

## 2023-07-13 DIAGNOSIS — R300 Dysuria: Secondary | ICD-10-CM | POA: Diagnosis not present

## 2023-07-13 DIAGNOSIS — E119 Type 2 diabetes mellitus without complications: Secondary | ICD-10-CM | POA: Diagnosis not present

## 2023-07-13 DIAGNOSIS — I69351 Hemiplegia and hemiparesis following cerebral infarction affecting right dominant side: Secondary | ICD-10-CM | POA: Diagnosis not present

## 2023-07-13 DIAGNOSIS — M65941 Unspecified synovitis and tenosynovitis, right hand: Secondary | ICD-10-CM | POA: Diagnosis not present

## 2023-07-13 DIAGNOSIS — J302 Other seasonal allergic rhinitis: Secondary | ICD-10-CM | POA: Diagnosis not present

## 2023-07-13 DIAGNOSIS — N343 Urethral syndrome, unspecified: Secondary | ICD-10-CM | POA: Diagnosis not present

## 2023-08-03 DIAGNOSIS — Z8719 Personal history of other diseases of the digestive system: Secondary | ICD-10-CM | POA: Diagnosis not present

## 2023-08-03 DIAGNOSIS — K5790 Diverticulosis of intestine, part unspecified, without perforation or abscess without bleeding: Secondary | ICD-10-CM | POA: Diagnosis not present

## 2023-08-03 DIAGNOSIS — E1165 Type 2 diabetes mellitus with hyperglycemia: Secondary | ICD-10-CM | POA: Diagnosis not present

## 2023-08-03 DIAGNOSIS — F3341 Major depressive disorder, recurrent, in partial remission: Secondary | ICD-10-CM | POA: Diagnosis not present

## 2023-08-03 DIAGNOSIS — N390 Urinary tract infection, site not specified: Secondary | ICD-10-CM | POA: Diagnosis not present

## 2023-08-03 DIAGNOSIS — Z8673 Personal history of transient ischemic attack (TIA), and cerebral infarction without residual deficits: Secondary | ICD-10-CM | POA: Diagnosis not present

## 2023-08-03 DIAGNOSIS — D62 Acute posthemorrhagic anemia: Secondary | ICD-10-CM | POA: Diagnosis not present

## 2023-08-10 DIAGNOSIS — E119 Type 2 diabetes mellitus without complications: Secondary | ICD-10-CM | POA: Diagnosis not present

## 2023-08-10 DIAGNOSIS — J302 Other seasonal allergic rhinitis: Secondary | ICD-10-CM | POA: Diagnosis not present

## 2023-08-10 DIAGNOSIS — R399 Unspecified symptoms and signs involving the genitourinary system: Secondary | ICD-10-CM | POA: Diagnosis not present

## 2023-08-10 DIAGNOSIS — M65941 Unspecified synovitis and tenosynovitis, right hand: Secondary | ICD-10-CM | POA: Diagnosis not present

## 2023-08-10 DIAGNOSIS — I69351 Hemiplegia and hemiparesis following cerebral infarction affecting right dominant side: Secondary | ICD-10-CM | POA: Diagnosis not present

## 2023-08-10 DIAGNOSIS — F32A Depression, unspecified: Secondary | ICD-10-CM | POA: Diagnosis not present

## 2023-08-10 DIAGNOSIS — M65942 Unspecified synovitis and tenosynovitis, left hand: Secondary | ICD-10-CM | POA: Diagnosis not present

## 2023-08-10 DIAGNOSIS — Z1331 Encounter for screening for depression: Secondary | ICD-10-CM | POA: Diagnosis not present

## 2023-08-10 DIAGNOSIS — R8290 Unspecified abnormal findings in urine: Secondary | ICD-10-CM | POA: Diagnosis not present

## 2023-08-10 DIAGNOSIS — Z0000 Encounter for general adult medical examination without abnormal findings: Secondary | ICD-10-CM | POA: Diagnosis not present

## 2023-08-29 ENCOUNTER — Other Ambulatory Visit: Payer: Self-pay | Admitting: Obstetrics and Gynecology

## 2023-12-05 DIAGNOSIS — R399 Unspecified symptoms and signs involving the genitourinary system: Secondary | ICD-10-CM | POA: Diagnosis not present

## 2023-12-05 DIAGNOSIS — E1165 Type 2 diabetes mellitus with hyperglycemia: Secondary | ICD-10-CM | POA: Diagnosis not present

## 2023-12-05 DIAGNOSIS — I10 Essential (primary) hypertension: Secondary | ICD-10-CM | POA: Diagnosis not present

## 2023-12-05 DIAGNOSIS — R8290 Unspecified abnormal findings in urine: Secondary | ICD-10-CM | POA: Diagnosis not present

## 2023-12-12 DIAGNOSIS — R5383 Other fatigue: Secondary | ICD-10-CM | POA: Diagnosis not present

## 2023-12-12 DIAGNOSIS — E119 Type 2 diabetes mellitus without complications: Secondary | ICD-10-CM | POA: Diagnosis not present

## 2023-12-12 DIAGNOSIS — R350 Frequency of micturition: Secondary | ICD-10-CM | POA: Diagnosis not present

## 2023-12-12 DIAGNOSIS — J302 Other seasonal allergic rhinitis: Secondary | ICD-10-CM | POA: Diagnosis not present

## 2023-12-12 DIAGNOSIS — I69351 Hemiplegia and hemiparesis following cerebral infarction affecting right dominant side: Secondary | ICD-10-CM | POA: Diagnosis not present

## 2023-12-12 DIAGNOSIS — R300 Dysuria: Secondary | ICD-10-CM | POA: Diagnosis not present
